# Patient Record
Sex: Male | Born: 1960
Health system: Southern US, Community
[De-identification: ages and names within clinical notes are randomized; demographics above are authoritative.]

## PROBLEM LIST (undated history)

## (undated) DIAGNOSIS — T7840XA Allergy, unspecified, initial encounter: Secondary | ICD-10-CM

## (undated) HISTORY — DX: Allergy, unspecified, initial encounter: T78.40XA

## (undated) HISTORY — PX: TONSILLECTOMY: SUR1361

## (undated) HISTORY — PX: OTHER SURGICAL HISTORY: SHX169

---

## 2005-01-20 ENCOUNTER — Ambulatory Visit (HOSPITAL_COMMUNITY): Admission: RE | Admit: 2005-01-20 | Discharge: 2005-01-20 | Payer: Self-pay | Admitting: Internal Medicine

## 2007-01-20 ENCOUNTER — Ambulatory Visit (HOSPITAL_COMMUNITY): Admission: RE | Admit: 2007-01-20 | Discharge: 2007-01-20 | Payer: Self-pay | Admitting: Internal Medicine

## 2008-03-23 ENCOUNTER — Ambulatory Visit (HOSPITAL_COMMUNITY): Admission: RE | Admit: 2008-03-23 | Discharge: 2008-03-23 | Payer: Self-pay | Admitting: Internal Medicine

## 2010-04-09 ENCOUNTER — Ambulatory Visit (HOSPITAL_COMMUNITY)
Admission: RE | Admit: 2010-04-09 | Discharge: 2010-04-09 | Disposition: A | Discharge status: Home / Self Care | Payer: BC Managed Care – PPO | Source: Ambulatory Visit | Attending: Internal Medicine | Admitting: Internal Medicine

## 2010-04-09 ENCOUNTER — Other Ambulatory Visit (HOSPITAL_COMMUNITY): Payer: Self-pay | Admitting: Internal Medicine

## 2010-04-09 DIAGNOSIS — F17200 Nicotine dependence, unspecified, uncomplicated: Secondary | ICD-10-CM | POA: Insufficient documentation

## 2010-04-09 DIAGNOSIS — J449 Chronic obstructive pulmonary disease, unspecified: Secondary | ICD-10-CM | POA: Insufficient documentation

## 2010-04-09 DIAGNOSIS — J4489 Other specified chronic obstructive pulmonary disease: Secondary | ICD-10-CM | POA: Insufficient documentation

## 2010-05-14 ENCOUNTER — Other Ambulatory Visit: Payer: BC Managed Care – PPO | Admitting: Gastroenterology

## 2011-09-22 ENCOUNTER — Ambulatory Visit (HOSPITAL_COMMUNITY)
Admission: RE | Admit: 2011-09-22 | Discharge: 2011-09-22 | Disposition: A | Discharge status: Home / Self Care | Payer: BC Managed Care – PPO | Source: Ambulatory Visit | Attending: Internal Medicine | Admitting: Internal Medicine

## 2011-09-22 ENCOUNTER — Other Ambulatory Visit (HOSPITAL_COMMUNITY): Payer: Self-pay | Admitting: Internal Medicine

## 2011-09-22 DIAGNOSIS — J449 Chronic obstructive pulmonary disease, unspecified: Secondary | ICD-10-CM | POA: Insufficient documentation

## 2011-09-22 DIAGNOSIS — Z0000 Encounter for general adult medical examination without abnormal findings: Secondary | ICD-10-CM

## 2011-09-22 DIAGNOSIS — J4489 Other specified chronic obstructive pulmonary disease: Secondary | ICD-10-CM | POA: Insufficient documentation

## 2011-09-22 DIAGNOSIS — F17200 Nicotine dependence, unspecified, uncomplicated: Secondary | ICD-10-CM | POA: Insufficient documentation

## 2012-09-23 ENCOUNTER — Other Ambulatory Visit (HOSPITAL_COMMUNITY): Payer: Self-pay | Admitting: Internal Medicine

## 2012-09-23 ENCOUNTER — Ambulatory Visit (HOSPITAL_COMMUNITY)
Admission: RE | Admit: 2012-09-23 | Discharge: 2012-09-23 | Disposition: A | Discharge status: Home / Self Care | Payer: BC Managed Care – PPO | Source: Ambulatory Visit | Attending: Internal Medicine | Admitting: Internal Medicine

## 2012-09-23 DIAGNOSIS — R030 Elevated blood-pressure reading, without diagnosis of hypertension: Secondary | ICD-10-CM

## 2012-09-23 DIAGNOSIS — J4489 Other specified chronic obstructive pulmonary disease: Secondary | ICD-10-CM | POA: Insufficient documentation

## 2012-09-23 DIAGNOSIS — J449 Chronic obstructive pulmonary disease, unspecified: Secondary | ICD-10-CM | POA: Insufficient documentation

## 2013-08-24 ENCOUNTER — Encounter: Payer: Self-pay | Admitting: Gastroenterology

## 2013-09-27 ENCOUNTER — Ambulatory Visit (INDEPENDENT_AMBULATORY_CARE_PROVIDER_SITE_OTHER): Payer: BC Managed Care – PPO | Admitting: Internal Medicine

## 2013-09-27 ENCOUNTER — Other Ambulatory Visit: Payer: Self-pay | Admitting: Internal Medicine

## 2013-09-27 ENCOUNTER — Encounter: Payer: Self-pay | Admitting: Internal Medicine

## 2013-09-27 VITALS — BP 116/66 | HR 72 | Temp 98.8°F | Resp 16 | Ht 68.0 in | Wt 164.0 lb

## 2013-09-27 DIAGNOSIS — E559 Vitamin D deficiency, unspecified: Secondary | ICD-10-CM | POA: Insufficient documentation

## 2013-09-27 DIAGNOSIS — Z113 Encounter for screening for infections with a predominantly sexual mode of transmission: Secondary | ICD-10-CM

## 2013-09-27 DIAGNOSIS — Z111 Encounter for screening for respiratory tuberculosis: Secondary | ICD-10-CM

## 2013-09-27 DIAGNOSIS — J45909 Unspecified asthma, uncomplicated: Secondary | ICD-10-CM | POA: Insufficient documentation

## 2013-09-27 DIAGNOSIS — Z79899 Other long term (current) drug therapy: Secondary | ICD-10-CM

## 2013-09-27 DIAGNOSIS — I10 Essential (primary) hypertension: Secondary | ICD-10-CM

## 2013-09-27 DIAGNOSIS — R7401 Elevation of levels of liver transaminase levels: Secondary | ICD-10-CM

## 2013-09-27 DIAGNOSIS — R740 Nonspecific elevation of levels of transaminase and lactic acid dehydrogenase [LDH]: Secondary | ICD-10-CM

## 2013-09-27 DIAGNOSIS — Z125 Encounter for screening for malignant neoplasm of prostate: Secondary | ICD-10-CM

## 2013-09-27 DIAGNOSIS — F17200 Nicotine dependence, unspecified, uncomplicated: Secondary | ICD-10-CM

## 2013-09-27 DIAGNOSIS — R7402 Elevation of levels of lactic acid dehydrogenase (LDH): Secondary | ICD-10-CM

## 2013-09-27 DIAGNOSIS — Z0000 Encounter for general adult medical examination without abnormal findings: Secondary | ICD-10-CM

## 2013-09-27 DIAGNOSIS — Z1212 Encounter for screening for malignant neoplasm of rectum: Secondary | ICD-10-CM

## 2013-09-27 LAB — CBC WITH DIFFERENTIAL/PLATELET
Basophils Absolute: 0 10*3/uL (ref 0.0–0.1)
Basophils Relative: 0 % (ref 0–1)
EOS ABS: 0.3 10*3/uL (ref 0.0–0.7)
Eosinophils Relative: 4 % (ref 0–5)
HCT: 46.4 % (ref 39.0–52.0)
Hemoglobin: 15.8 g/dL (ref 13.0–17.0)
Lymphocytes Relative: 18 % (ref 12–46)
Lymphs Abs: 1.4 10*3/uL (ref 0.7–4.0)
MCH: 30.9 pg (ref 26.0–34.0)
MCHC: 34.1 g/dL (ref 30.0–36.0)
MCV: 90.8 fL (ref 78.0–100.0)
Monocytes Absolute: 0.5 10*3/uL (ref 0.1–1.0)
Monocytes Relative: 6 % (ref 3–12)
NEUTROS PCT: 72 % (ref 43–77)
Neutro Abs: 5.4 10*3/uL (ref 1.7–7.7)
Platelets: 242 10*3/uL (ref 150–400)
RBC: 5.11 MIL/uL (ref 4.22–5.81)
RDW: 14.2 % (ref 11.5–15.5)
WBC: 7.5 10*3/uL (ref 4.0–10.5)

## 2013-09-27 LAB — HEMOGLOBIN A1C
HEMOGLOBIN A1C: 5.8 % — AB (ref <5.7)
MEAN PLASMA GLUCOSE: 120 mg/dL — AB (ref <117)

## 2013-09-27 MED ORDER — ALBUTEROL SULFATE HFA 108 (90 BASE) MCG/ACT IN AERS: 1.0000 | INHALATION_SPRAY | RESPIRATORY_TRACT | Status: DC | PRN

## 2013-09-27 NOTE — Progress Notes (Signed)
Patient ID: Dakota Yang, male   DOB: April 14, 1960, 53 y.o.   MRN: [id number]   Annual Screening Comprehensive Examination   This very nice 53 y.o.MWM presents for complete physical.  Patient has no major health issues. He does have hx/o infrequent mild flares of asthma related to allergies and always controlled with an prn Albuterol MDI.    Also, patient has a hx/o elevated A1c 5.7% in 2012 and 5.8% in 8/2013and again 5.7% in 09/2012. Finally, patient has history of Vitamin D Deficiency of 27 in 2012.   Medication List   albuterol 108 (90 BASE) MCG/ACT inhaler  Commonly known as:  VENTOLIN HFA  Inhale 1-2 puffs into the lungs every 4 (four) hours as needed for wheezing or shortness of breath.     multivitamin tablet  Take 1 tablet by mouth daily.     OVER THE COUNTER MEDICATION  Protein powder     VITAMIN D PO  Take 4,000 Units by mouth daily.     Allergies  Allergen Reactions  . Trovan [Alatrofloxacin] Other (See Comments)    Light-headed feeling   PMHx- Hx/o Asrhma, Vit D deficiency. PSHx- none  History   Social History  . Marital Status: Married    Spouse Name: N/A    Number of Children: N/A  . Years of Education: N/A   Occupational History  . Chemist for [employer]   Social History Main Topics  . Smoking status: Current Every Day Smoker -- 0.50 packs/day    Types: Cigarettes  . Smokeless tobacco: Not on file  . Alcohol Use: 5.0 oz/week    10 drink(s) per week  . Drug Use: Not on file  . Sexual Activity: Not on file    ROS Constitutional: Denies fever, chills, weight loss/gain, headaches, insomnia, fatigue, night sweats, and change in appetite. Eyes: Denies redness, blurred vision, diplopia, discharge, itchy, watery eyes.  ENT: Denies discharge, congestion, post nasal drip, epistaxis, sore throat, earache, hearing loss, dental pain, Tinnitus, Vertigo, Sinus pain, snoring.  Cardio: Denies chest pain, palpitations, irregular heartbeat, syncope,  dyspnea, diaphoresis, orthopnea, PND, claudication, edema Respiratory: denies cough, dyspnea, DOE, pleurisy, hoarseness, laryngitis, wheezing.  Gastrointestinal: Denies dysphagia, heartburn, reflux, water brash, pain, cramps, nausea, vomiting, bloating, diarrhea, constipation, hematemesis, melena, hematochezia, jaundice, hemorrhoids Genitourinary: Denies dysuria, frequency, urgency, nocturia, hesitancy, discharge, hematuria, flank pain Breast: Breast lumps, nipple discharge, bleeding.  Musculoskeletal: Denies arthralgia, myalgia, stiffness, Jt. Swelling, pain, limp, and strain/sprain. Skin: Denies puritis, rash, hives, warts, acne, eczema, changing in skin lesion Neuro: Weakness, tremor, incoordination, spasms, paresthesia, pain Psychiatric: Denies confusion, memory loss, sensory loss. Endocrine: Denies change in weight, skin, hair change, nocturia, and paresthesia, diabetic polys, visual blurring, hyper /hypo glycemic episodes.  Heme/Lymph: No excessive bleeding, bruising, enlarged lymph nodes.   Physical Exam  BP 116/66  Pulse 72  Temp(Src) 98.8 F (37.1 C) (Temporal)  Resp 16  Ht 5\' 8"  (1.727 m)  Wt 164 lb (74.39 kg)  BMI 24.94 kg/m2  General Appearance: Well nourished and in no apparent distress. Eyes: PERRLA, EOMs, conjunctiva no swelling or erythema, normal fundi and vessels. Sinuses: No frontal/maxillary tenderness ENT/Mouth: EACs patent / TMs  nl. Nares clear without erythema, swelling, mucoid exudates. Oral hygiene is good. No erythema, swelling, or exudate. Tongue normal, non-obstructing. Tonsils not swollen or erythematous. Hearing normal.  Neck: Supple, thyroid normal. No bruits, nodes or JVD. Respiratory: Respiratory effort normal.  BS equal and clear bilateral without rales, rhonci, wheezing or stridor. Cardio: Heart sounds are normal with  regular rate and rhythm and no murmurs, rubs or gallops. Peripheral pulses are normal and equal bilaterally without edema. No aortic or  femoral bruits. Chest: symmetric with normal excursions and percussion.  Abdomen: Flat, soft, with bowl sounds. Nontender, no guarding, rebound, hernias, masses, or organomegaly.  Lymphatics: Non tender without lymphadenopathy.  Genitourinary:  Musculoskeletal: Full ROM all peripheral extremities, joint stability, 5/5 strength, and normal gait. Skin: Warm and dry without rashes, lesions, cyanosis, clubbing or  ecchymosis.  Neuro: Cranial nerves intact, reflexes equal bilaterally. Normal muscle tone, no cerebellar symptoms. Sensation intact.  Pysch: Awake and oriented X 3, normal affect, Insight and Judgment appropriate.   Assessment and Plan  1. Annual Screening Examination 2. Labile HTN 3.  Asthma 4.  Vitamin D Deficiency  Continue prudent diet as discussed, weight control, regular exercise, and medications. Routine screening labs and tests as requested with regular follow-up as recommended.

## 2013-09-27 NOTE — Patient Instructions (Signed)
Recommend the book "The END of DIETING" by Dr Baker Janus   and the book "The END of DIABETES " by Dr Excell Seltzer  At [email or web address] - get book & Audio CD's      Being diabetic has a  300% increased risk for heart attack, stroke, cancer, and alzheimer- type vascular dementia. It is very important that you work harder with diet by avoiding all foods that are white except chicken & fish. Avoid white rice (brown & wild rice is OK), white potatoes (sweetpotatoes in moderation is OK), White bread or wheat bread or anything made out of white flour like bagels, donuts, rolls, buns, biscuits, cakes, pastries, cookies, pizza crust, and pasta (made from white flour & egg whites) - vegetarian pasta or spinach or wheat pasta is OK. Multigrain breads like Arnold's or [employer], or multigrain sandwich thins or flatbreads.  Diet, exercise and weight loss can reverse and cure diabetes in the early stages.  Diet, exercise and weight loss is very important in the control and prevention of complications of diabetes which affects every system in your body, ie. Brain - dementia/stroke, eyes - glaucoma/blindness, heart - heart attack/heart failure, kidneys - dialysis, stomach - gastric paralysis, intestines - malabsorption, nerves - severe painful neuritis, circulation - gangrene & loss of a leg(s), and finally cancer and Alzheimers.    I recommend avoid fried & greasy foods,  sweets/candy, white rice (brown or wild rice or Quinoa is OK), white potatoes (sweet potatoes are OK) - anything made from white flour - bagels, doughnuts, rolls, buns, biscuits,white and wheat breads, pizza crust and traditional pasta made of white flour & egg white(vegetarian pasta or spinach or wheat pasta is OK).  Multi-grain bread is OK - like multi-grain flat bread or sandwich thins. Avoid alcohol in excess. Exercise is also important.    Eat all the vegetables you want - avoid meat, especially red meat and dairy - especially cheese.  Cheese  is the most concentrated form of trans-fats which is the worst thing to clog up our arteries. Veggie cheese is OK which can be found in the fresh produce section at [employer] or [employer] or [place] [employer] A healthy lifestyle and preventative care can promote health and wellness.  Maintain regular health, dental, and eye exams.  Eat a healthy diet. Foods like vegetables, fruits, whole grains, low-fat dairy products, and lean protein foods contain the nutrients you need and are low in calories. Decrease your intake of foods high in solid fats, added sugars, and salt. Get information about a proper diet from your health care provider, if necessary.  Regular physical exercise is one of the most important things you can do for your health. Most adults should get at least 150 minutes of moderate-intensity exercise (any activity that increases your heart rate and causes you to sweat) each week. In addition, most adults need muscle-strengthening exercises on 2 or more days a week.   Maintain a healthy weight. The body mass index (BMI) is a screening tool to identify possible weight problems. It provides an estimate of body fat based on height and weight. Your health care provider can find your BMI and can help you achieve or maintain a healthy weight. For males 20 years and older:  A BMI below 18.5 is considered underweight.  A BMI of 18.5 to 24.9 is normal.  A BMI of 25 to 29.9 is considered overweight.  A BMI of 30 and above is considered  obese.  Maintain normal blood lipids and cholesterol by exercising and minimizing your intake of saturated fat. Eat a balanced diet with plenty of fruits and vegetables. Blood tests for lipids and cholesterol should begin at age 29 and be repeated every 5 years. If your lipid or cholesterol levels are high, you are over age 76, or you are at high risk for heart disease, you may need your cholesterol levels checked more frequently.Ongoing  high lipid and cholesterol levels should be treated with medicines if diet and exercise are not working.  If you smoke, find out from your health care provider how to quit. If you do not use tobacco, do not start.  Lung cancer screening is recommended for adults aged 78-80 years who are at high risk for developing lung cancer because of a history of smoking. A yearly low-dose CT scan of the lungs is recommended for people who have at least a 30-pack-year history of smoking and are current smokers or have quit within the past 15 years. A pack year of smoking is smoking an average of 1 pack of cigarettes a day for 1 year (for example, a 30-pack-year history of smoking could mean smoking 1 pack a day for 30 years or 2 packs a day for 15 years). Yearly screening should continue until the smoker has stopped smoking for at least 15 years. Yearly screening should be stopped for people who develop a health problem that would prevent them from having lung cancer treatment.  If you choose to drink alcohol, do not have more than 2 drinks per day. One drink is considered to be 12 oz (360 mL) of beer, 5 oz (150 mL) of wine, or 1.5 oz (45 mL) of liquor.  Avoid the use of street drugs. Do not share needles with anyone. Ask for help if you need support or instructions about stopping the use of drugs.  High blood pressure causes heart disease and increases the risk of stroke. Blood pressure should be checked at least every 1-2 years. Ongoing high blood pressure should be treated with medicines if weight loss and exercise are not effective.  If you are 34-62 years old, ask your health care provider if you should take aspirin to prevent heart disease.  Diabetes screening involves taking a blood sample to check your fasting blood sugar level. This should be done once every 3 years after age 35 if you are at a normal weight and without risk factors for diabetes. Testing should be considered at a younger age or be carried  out more frequently if you are overweight and have at least 1 risk factor for diabetes.  Colorectal cancer can be detected and often prevented. Most routine colorectal cancer screening begins at the age of 104 and continues through age 10. However, your health care provider may recommend screening at an earlier age if you have risk factors for colon cancer. On a yearly basis, your health care provider may provide home test kits to check for hidden blood in the stool. A small camera at the end of a tube may be used to directly examine the colon (sigmoidoscopy or colonoscopy) to detect the earliest forms of colorectal cancer. Talk to your health care provider about this at age 54 when routine screening begins. A direct exam of the colon should be repeated every 5-10 years through age 34, unless early forms of precancerous polyps or small growths are found.  People who are at an increased risk for hepatitis B should be  screened for this virus. You are considered at high risk for hepatitis B if:  You were born in a country where hepatitis B occurs often. Talk with your health care provider about which countries are considered high risk.  Your parents were born in a high-risk country and you have not received a shot to protect against hepatitis B (hepatitis B vaccine).  You have HIV or AIDS.  You use needles to inject street drugs.  You live with, or have sex with, someone who has hepatitis B.  You are a man who has sex with other men (MSM).  You get hemodialysis treatment.  You take certain medicines for conditions like cancer, organ transplantation, and autoimmune conditions.  Hepatitis C blood testing is recommended for all people born from 18 through 1965 and any individual with known risk factors for hepatitis C.  Healthy men should no longer receive prostate-specific antigen (PSA) blood tests as part of routine cancer screening. Talk to your health care provider about prostate cancer  screening.  Testicular cancer screening is not recommended for adolescents or adult males who have no symptoms. Screening includes self-exam, a health care provider exam, and other screening tests. Consult with your health care provider about any symptoms you have or any concerns you have about testicular cancer.  Practice safe sex. Use condoms and avoid high-risk sexual practices to reduce the spread of sexually transmitted infections (STIs).  You should be screened for STIs, including gonorrhea and chlamydia if:  You are sexually active and are younger than 24 years.  You are older than 24 years, and your health care provider tells you that you are at risk for this type of infection.  Your sexual activity has changed since you were last screened, and you are at an increased risk for chlamydia or gonorrhea. Ask your health care provider if you are at risk.  If you are at risk of being infected with HIV, it is recommended that you take a prescription medicine daily to prevent HIV infection. This is called pre-exposure prophylaxis (PrEP). You are considered at risk if:  You are a man who has sex with other men (MSM).  You are a heterosexual man who is sexually active with multiple partners.  You take drugs by injection.  You are sexually active with a partner who has HIV.  Talk with your health care provider about whether you are at high risk of being infected with HIV. If you choose to begin PrEP, you should first be tested for HIV. You should then be tested every 3 months for as long as you are taking PrEP.  Use sunscreen. Apply sunscreen liberally and repeatedly throughout the day. You should seek shade when your shadow is shorter than you. Protect yourself by wearing long sleeves, pants, a wide-brimmed hat, and sunglasses year round whenever you are outdoors.  Tell your health care provider of new moles or changes in moles, especially if there is a change in shape or color. Also, tell  your health care provider if a mole is larger than the size of a pencil eraser.  A one-time screening for abdominal aortic aneurysm (AAA) and surgical repair of large AAAs by ultrasound is recommended for men aged 12-75 years who are current or former smokers.  Stay current with your vaccines (immunizations).  Preventive Care for Adults A healthy lifestyle and preventive care can promote health and wellness. Preventive health guidelines for men include the following key practices:  A routine yearly physical is a  good way to check with your health care provider about your health and preventative screening. It is a chance to share any concerns and updates on your health and to receive a thorough exam.  Visit your dentist for a routine exam and preventative care every 6 months. Brush your teeth twice a day and floss once a day. Good oral hygiene prevents tooth decay and gum disease.  The frequency of eye exams is based on your age, health, family medical history, use of contact lenses, and other factors. Follow your health care provider's recommendations for frequency of eye exams.  Eat a healthy diet. Foods such as vegetables, fruits, whole grains, low-fat dairy products, and lean protein foods contain the nutrients you need without too many calories. Decrease your intake of foods high in solid fats, added sugars, and salt. Eat the right amount of calories for you.Get information about a proper diet from your health care provider, if necessary.  Regular physical exercise is one of the most important things you can do for your health. Most adults should get at least 150 minutes of moderate-intensity exercise (any activity that increases your heart rate and causes you to sweat) each week. In addition, most adults need muscle-strengthening exercises on 2 or more days a week.  Maintain a healthy weight. The body mass index (BMI) is a screening tool to identify possible weight problems. It provides an  estimate of body fat based on height and weight. Your health care provider can find your BMI and can help you achieve or maintain a healthy weight.For adults 20 years and older:  A BMI below 18.5 is considered underweight.  A BMI of 18.5 to 24.9 is normal.  A BMI of 25 to 29.9 is considered overweight.  A BMI of 30 and above is considered obese.  Maintain normal blood lipids and cholesterol levels by exercising and minimizing your intake of saturated fat. Eat a balanced diet with plenty of fruit and vegetables. Blood tests for lipids and cholesterol should begin at age 26 and be repeated every 5 years. If your lipid or cholesterol levels are high, you are over 50, or you are at high risk for heart disease, you may need your cholesterol levels checked more frequently.Ongoing high lipid and cholesterol levels should be treated with medicines if diet and exercise are not working.  If you smoke, find out from your health care provider how to quit. If you do not use tobacco, do not start.  Lung cancer screening is recommended for adults aged 37-80 years who are at high risk for developing lung cancer because of a history of smoking. A yearly low-dose CT scan of the lungs is recommended for people who have at least a 30-pack-year history of smoking and are a current smoker or have quit within the past 15 years. A pack year of smoking is smoking an average of 1 pack of cigarettes a day for 1 year (for example: 1 pack a day for 30 years or 2 packs a day for 15 years). Yearly screening should continue until the smoker has stopped smoking for at least 15 years. Yearly screening should be stopped for people who develop a health problem that would prevent them from having lung cancer treatment.  If you choose to drink alcohol, do not have more than 2 drinks per day. One drink is considered to be 12 ounces (355 mL) of beer, 5 ounces (148 mL) of wine, or 1.5 ounces (44 mL) of liquor.  Avoid use  of street  drugs. Do not share needles with anyone. Ask for help if you need support or instructions about stopping the use of drugs.  High blood pressure causes heart disease and increases the risk of stroke. Your blood pressure should be checked at least every 1-2 years. Ongoing high blood pressure should be treated with medicines, if weight loss and exercise are not effective.  If you are 35-57 years old, ask your health care provider if you should take aspirin to prevent heart disease.  Diabetes screening involves taking a blood sample to check your fasting blood sugar level. This should be done once every 3 years, after age 14, if you are within normal weight and without risk factors for diabetes. Testing should be considered at a younger age or be carried out more frequently if you are overweight and have at least 1 risk factor for diabetes.  Colorectal cancer can be detected and often prevented. Most routine colorectal cancer screening begins at the age of 18 and continues through age 27. However, your health care provider may recommend screening at an earlier age if you have risk factors for colon cancer. On a yearly basis, your health care provider may provide home test kits to check for hidden blood in the stool. Use of a small camera at the end of a tube to directly examine the colon (sigmoidoscopy or colonoscopy) can detect the earliest forms of colorectal cancer. Talk to your health care provider about this at age 42, when routine screening begins. Direct exam of the colon should be repeated every 5-10 years through age 88, unless early forms of precancerous polyps or small growths are found.  People who are at an increased risk for hepatitis B should be screened for this virus. You are considered at high risk for hepatitis B if:  You were born in a country where hepatitis B occurs often. Talk with your health care provider about which countries are considered high risk.  Your parents were born in a  high-risk country and you have not received a shot to protect against hepatitis B (hepatitis B vaccine).  You have HIV or AIDS.  You use needles to inject street drugs.  You live with, or have sex with, someone who has hepatitis B.  You are a man who has sex with other men (MSM).  You get hemodialysis treatment.  You take certain medicines for conditions such as cancer, organ transplantation, and autoimmune conditions.  Hepatitis C blood testing is recommended for all people born from [birth year] through 1966 and any individual with known risks for hepatitis C.  Practice safe sex. Use condoms and avoid high-risk sexual practices to reduce the spread of sexually transmitted infections (STIs). STIs include gonorrhea, chlamydia, syphilis, trichomonas, herpes, HPV, and human immunodeficiency virus (HIV). Herpes, HIV, and HPV are viral illnesses that have no cure. They can result in disability, cancer, and death.  If you are at risk of being infected with HIV, it is recommended that you take a prescription medicine daily to prevent HIV infection. This is called preexposure prophylaxis (PrEP). You are considered at risk if:  You are a man who has sex with other men (MSM) and have other risk factors.  You are a heterosexual man, are sexually active, and are at increased risk for HIV infection.  You take drugs by injection.  You are sexually active with a partner who has HIV.  Talk with your health care provider about whether you are at high risk of  being infected with HIV. If you choose to begin PrEP, you should first be tested for HIV. You should then be tested every 3 months for as long as you are taking PrEP.  A one-time screening for abdominal aortic aneurysm (AAA) and surgical repair of large AAAs by ultrasound are recommended for men ages 68 to 30 years who are current or former smokers.  Healthy men should no longer receive prostate-specific antigen (PSA) blood tests as part of routine  cancer screening. Talk with your health care provider about prostate cancer screening.  Testicular cancer screening is not recommended for adult males who have no symptoms. Screening includes self-exam, a health care provider exam, and other screening tests. Consult with your health care provider about any symptoms you have or any concerns you have about testicular cancer.  Use sunscreen. Apply sunscreen liberally and repeatedly throughout the day. You should seek shade when your shadow is shorter than you. Protect yourself by wearing long sleeves, pants, a wide-brimmed hat, and sunglasses year round, whenever you are outdoors.  Once a month, do a whole-body skin exam, using a mirror to look at the skin on your back. Tell your health care provider about new moles, moles that have irregular borders, moles that are larger than a pencil eraser, or moles that have changed in shape or color.  Stay current with required vaccines (immunizations).  Influenza vaccine. All adults should be immunized every year.  Tetanus, diphtheria, and acellular pertussis (Td, Tdap) vaccine. An adult who has not previously received Tdap or who does not know his vaccine status should receive 1 dose of Tdap. This initial dose should be followed by tetanus and diphtheria toxoids (Td) booster doses every 10 years. Adults with an unknown or incomplete history of completing a 3-dose immunization series with Td-containing vaccines should begin or complete a primary immunization series including a Tdap dose. Adults should receive a Td booster every 10 years.  Varicella vaccine. An adult without evidence of immunity to varicella should receive 2 doses or a second dose if he has previously received 1 dose.  Human papillomavirus (HPV) vaccine. Males aged 32-21 years who have not received the vaccine previously should receive the 3-dose series. Males aged 22-26 years may be immunized. Immunization is recommended through the age of 15  years for any male who has sex with males and did not get any or all doses earlier. Immunization is recommended for any person with an immunocompromised condition through the age of 58 years if he did not get any or all doses earlier. During the 3-dose series, the second dose should be obtained 4-8 weeks after the first dose. The third dose should be obtained 24 weeks after the first dose and 16 weeks after the second dose.  Zoster vaccine. One dose is recommended for adults aged 35 years or older unless certain conditions are present.  Measles, mumps, and rubella (MMR) vaccine. Adults born before [birth year] generally are considered immune to measles and mumps. Adults born in [birth year] or later should have 1 or more doses of MMR vaccine unless there is a contraindication to the vaccine or there is laboratory evidence of immunity to each of the three diseases. A routine second dose of MMR vaccine should be obtained at least 28 days after the first dose for students attending postsecondary schools, health care workers, or international travelers. People who received inactivated measles vaccine or an unknown type of measles vaccine during 1963-1967 should receive 2 doses of MMR vaccine. People who  received inactivated mumps vaccine or an unknown type of mumps vaccine before 1979 and are at high risk for mumps infection should consider immunization with 2 doses of MMR vaccine. Unvaccinated health care workers born before [birth year] who lack laboratory evidence of measles, mumps, or rubella immunity or laboratory confirmation of disease should consider measles and mumps immunization with 2 doses of MMR vaccine or rubella immunization with 1 dose of MMR vaccine.  Pneumococcal 13-valent conjugate (PCV13) vaccine. When indicated, a person who is uncertain of his immunization history and has no record of immunization should receive the PCV13 vaccine. An adult aged 70 years or older who has certain medical conditions and has not been  previously immunized should receive 1 dose of PCV13 vaccine. This PCV13 should be followed with a dose of pneumococcal polysaccharide (PPSV23) vaccine. The PPSV23 vaccine dose should be obtained at least 8 weeks after the dose of PCV13 vaccine. An adult aged 26 years or older who has certain medical conditions and previously received 1 or more doses of PPSV23 vaccine should receive 1 dose of PCV13. The PCV13 vaccine dose should be obtained 1 or more years after the last PPSV23 vaccine dose.  Pneumococcal polysaccharide (PPSV23) vaccine. When PCV13 is also indicated, PCV13 should be obtained first. All adults aged 86 years and older should be immunized. An adult younger than age 10 years who has certain medical conditions should be immunized. Any person who resides in a nursing home or long-term care facility should be immunized. An adult smoker should be immunized. People with an immunocompromised condition and certain other conditions should receive both PCV13 and PPSV23 vaccines. People with human immunodeficiency virus (HIV) infection should be immunized as soon as possible after diagnosis. Immunization during chemotherapy or radiation therapy should be avoided. Routine use of PPSV23 vaccine is not recommended for American Indians, [place] Natives, or people younger than 65 years unless there are medical conditions that require PPSV23 vaccine. When indicated, people who have unknown immunization and have no record of immunization should receive PPSV23 vaccine. One-time revaccination 5 years after the first dose of PPSV23 is recommended for people aged 19-64 years who have chronic kidney failure, nephrotic syndrome, asplenia, or immunocompromised conditions. People who received 1-2 doses of PPSV23 before age 67 years should receive another dose of PPSV23 vaccine at age 27 years or later if at least 5 years have passed since the previous dose. Doses of PPSV23 are not needed for people immunized with PPSV23 at or  after age 20 years.  Meningococcal vaccine. Adults with asplenia or persistent complement component deficiencies should receive 2 doses of quadrivalent meningococcal conjugate (MenACWY-D) vaccine. The doses should be obtained at least 2 months apart. Microbiologists working with certain meningococcal bacteria, [employer] recruits, people at risk during an outbreak, and people who travel to or live in countries with a high rate of meningitis should be immunized. A first-year college student up through age 49 years who is living in a residence hall should receive a dose if he did not receive a dose on or after his 16th birthday. Adults who have certain high-risk conditions should receive one or more doses of vaccine.  Hepatitis A vaccine. Adults who wish to be protected from this disease, have certain high-risk conditions, work with hepatitis A-infected animals, work in hepatitis A research labs, or travel to or work in countries with a high rate of hepatitis A should be immunized. Adults who were previously unvaccinated and who anticipate close contact with an international adoptee during  the first 60 days after arrival in the [place] from a country with a high rate of hepatitis A should be immunized.  Hepatitis B vaccine. Adults should be immunized if they wish to be protected from this disease, have certain high-risk conditions, may be exposed to blood or other infectious body fluids, are household contacts or sex partners of hepatitis B positive people, are clients or workers in certain care facilities, or travel to or work in countries with a high rate of hepatitis B.  Haemophilus influenzae type b (Hib) vaccine. A previously unvaccinated person with asplenia or sickle cell disease or having a scheduled splenectomy should receive 1 dose of Hib vaccine. Regardless of previous immunization, a recipient of a hematopoietic stem cell transplant should receive a 3-dose series 6-12 months after his  successful transplant. Hib vaccine is not recommended for adults with HIV infection. Preventive Service / Frequency Ages 40 to 80  Blood pressure check.** / Every 1 to 2 years.  Lipid and cholesterol check.** / Every 5 years beginning at age 79.  Lung cancer screening. / Every year if you are aged 33-80 years and have a 30-pack-year history of smoking and currently smoke or have quit within the past 15 years. Yearly screening is stopped once you have quit smoking for at least 15 years or develop a health problem that would prevent you from having lung cancer treatment.  Fecal occult blood test (FOBT) of stool. / Every year beginning at age 36 and continuing until age 2. You may not have to do this test if you get a colonoscopy every 10 years.  Flexible sigmoidoscopy** or colonoscopy.** / Every 5 years for a flexible sigmoidoscopy or every 10 years for a colonoscopy beginning at age 47 and continuing until age 28.  Hepatitis C blood test.** / For all people born from [birth year] through 1965 and any individual with known risks for hepatitis C.  Skin self-exam. / Monthly.  Influenza vaccine. / Every year.  Tetanus, diphtheria, and acellular pertussis (Tdap/Td) vaccine.** / Consult your health care provider. 1 dose of Td every 10 years.  Varicella vaccine.** / Consult your health care provider.  Zoster vaccine.** / 1 dose for adults aged 68 years or older.  Measles, mumps, rubella (MMR) vaccine.** / You need at least 1 dose of MMR if you were born in 1957 or later. You may also need a second dose.  Pneumococcal 13-valent conjugate (PCV13) vaccine.** / Consult your health care provider.  Pneumococcal polysaccharide (PPSV23) vaccine.** / 1 to 2 doses if you smoke cigarettes or if you have certain conditions.  Meningococcal vaccine.** / Consult your health care provider.  Hepatitis A vaccine.** / Consult your health care provider.  Hepatitis B vaccine.** / Consult your health care  provider.  Haemophilus influenzae type b (Hib) vaccine.** / Consult your health care provider.    Hepatitis A vaccine.** / Consult your health care provider.  Hepatitis B vaccine.** / Consult your health care provider.  Haemophilus influenzae type b (Hib) vaccine.** / Consult your health care provider.

## 2013-09-28 LAB — HEPATIC FUNCTION PANEL
ALT: 20 U/L (ref 0–53)
AST: 24 U/L (ref 0–37)
Albumin: 4.3 g/dL (ref 3.5–5.2)
Alkaline Phosphatase: 37 U/L — ABNORMAL LOW (ref 39–117)
Bilirubin, Direct: 0.1 mg/dL (ref 0.0–0.3)
Indirect Bilirubin: 0.3 mg/dL (ref 0.2–1.2)
Total Bilirubin: 0.4 mg/dL (ref 0.2–1.2)
Total Protein: 6.5 g/dL (ref 6.0–8.3)

## 2013-09-28 LAB — PSA: PSA: 0.35 ng/mL (ref <=4.00)

## 2013-09-28 LAB — MAGNESIUM: Magnesium: 2 mg/dL (ref 1.5–2.5)

## 2013-09-28 LAB — URINALYSIS, MICROSCOPIC ONLY
Bacteria, UA: NONE SEEN
Casts: NONE SEEN
Crystals: NONE SEEN
Squamous Epithelial / LPF: NONE SEEN

## 2013-09-28 LAB — TSH: TSH: 1.325 u[IU]/mL (ref 0.350–4.500)

## 2013-09-28 LAB — LIPID PANEL
CHOL/HDL RATIO: 2.5 ratio
Cholesterol: 171 mg/dL (ref 0–200)
HDL: 68 mg/dL (ref >39)
LDL Cholesterol: 89 mg/dL (ref 0–99)
Triglycerides: 70 mg/dL (ref <150)
VLDL: 14 mg/dL (ref 0–40)

## 2013-09-28 LAB — BASIC METABOLIC PANEL WITH GFR
BUN: 15 mg/dL (ref 6–23)
CO2: 27 mEq/L (ref 19–32)
Calcium: 8.9 mg/dL (ref 8.4–10.5)
Chloride: 104 mEq/L (ref 96–112)
Creat: 1.16 mg/dL (ref 0.50–1.35)
GFR, Est African American: 83 mL/min
GFR, Est Non African American: 71 mL/min
Glucose, Bld: 100 mg/dL — ABNORMAL HIGH (ref 70–99)
Potassium: 4.2 mEq/L (ref 3.5–5.3)
Sodium: 138 mEq/L (ref 135–145)

## 2013-09-28 LAB — HIV ANTIBODY (ROUTINE TESTING W REFLEX): HIV 1&2 Ab, 4th Generation: NONREACTIVE

## 2013-09-28 LAB — MICROALBUMIN / CREATININE URINE RATIO
Creatinine, Urine: 135.8 mg/dL
Microalb Creat Ratio: 3.7 mg/g (ref 0.0–30.0)
Microalb, Ur: 0.5 mg/dL (ref 0.00–1.89)

## 2013-09-28 LAB — RPR

## 2013-09-28 LAB — TESTOSTERONE: Testosterone: 431 ng/dL (ref 300–890)

## 2013-09-28 LAB — INSULIN, FASTING: Insulin fasting, serum: 35 u[IU]/mL — ABNORMAL HIGH (ref 3–28)

## 2013-09-28 LAB — HEPATITIS B CORE ANTIBODY, TOTAL: Hep B Core Total Ab: NONREACTIVE

## 2013-09-28 LAB — VITAMIN B12: Vitamin B-12: 497 pg/mL (ref 211–911)

## 2013-09-28 LAB — HEPATITIS A ANTIBODY, TOTAL: Hep A Total Ab: BORDERLINE — AB

## 2013-09-28 LAB — HEPATITIS B SURFACE ANTIBODY,QUALITATIVE: Hep B S Ab: NEGATIVE

## 2013-09-28 LAB — HEPATITIS C ANTIBODY: HCV AB: NEGATIVE

## 2013-09-28 LAB — VITAMIN D 25 HYDROXY (VIT D DEFICIENCY, FRACTURES): Vit D, 25-Hydroxy: 58 ng/mL (ref 30–89)

## 2013-09-29 LAB — HEPATITIS B E ANTIBODY: Hepatitis Be Antibody: NONREACTIVE

## 2013-09-30 LAB — TB SKIN TEST
Induration: 0 mm
TB SKIN TEST: NEGATIVE

## 2013-10-07 ENCOUNTER — Ambulatory Visit (HOSPITAL_COMMUNITY)
Admission: RE | Admit: 2013-10-07 | Discharge: 2013-10-07 | Disposition: A | Discharge status: Home / Self Care | Payer: BC Managed Care – PPO | Source: Ambulatory Visit | Attending: Internal Medicine | Admitting: Internal Medicine

## 2013-10-07 DIAGNOSIS — R05 Cough: Secondary | ICD-10-CM | POA: Diagnosis present

## 2013-10-07 DIAGNOSIS — R059 Cough, unspecified: Secondary | ICD-10-CM | POA: Diagnosis not present

## 2013-10-07 DIAGNOSIS — F17200 Nicotine dependence, unspecified, uncomplicated: Secondary | ICD-10-CM

## 2013-10-25 ENCOUNTER — Ambulatory Visit (AMBULATORY_SURGERY_CENTER): Payer: Self-pay | Admitting: *Deleted

## 2013-10-25 VITALS — Ht 69.0 in | Wt 160.6 lb

## 2013-10-25 DIAGNOSIS — Z1211 Encounter for screening for malignant neoplasm of colon: Secondary | ICD-10-CM

## 2013-10-25 MED ORDER — NA SULFATE-K SULFATE-MG SULF 17.5-3.13-1.6 GM/177ML PO SOLN: 1.0000 | Freq: Once | ORAL | Status: DC

## 2013-10-25 NOTE — Progress Notes (Signed)
No problems with past sedation. ewm No home 02 use. ewm No cpap use. ewm No egg or soy product allergy. ewm

## 2013-10-26 ENCOUNTER — Other Ambulatory Visit: Payer: Self-pay | Admitting: *Deleted

## 2013-10-26 DIAGNOSIS — Z1212 Encounter for screening for malignant neoplasm of rectum: Secondary | ICD-10-CM

## 2013-10-26 LAB — POC HEMOCCULT BLD/STL (HOME/3-CARD/SCREEN)
FECAL OCCULT BLD: NEGATIVE
FECAL OCCULT BLD: NEGATIVE
FECAL OCCULT BLD: NEGATIVE

## 2013-10-31 ENCOUNTER — Encounter: Payer: Self-pay | Admitting: Gastroenterology

## 2013-11-04 ENCOUNTER — Telehealth: Payer: Self-pay | Admitting: Gastroenterology

## 2013-11-04 NOTE — Telephone Encounter (Signed)
Give miralax per dr Arlyce Dice  New instructions faxed to pharmacy  Left message for pt

## 2013-11-05 ENCOUNTER — Telehealth: Payer: Self-pay | Admitting: Internal Medicine

## 2013-11-05 NOTE — Telephone Encounter (Signed)
Needs prep instructions Sent by My chart

## 2013-11-07 ENCOUNTER — Encounter: Payer: Self-pay | Admitting: Gastroenterology

## 2013-11-07 ENCOUNTER — Ambulatory Visit (AMBULATORY_SURGERY_CENTER): Payer: BC Managed Care – PPO | Admitting: Gastroenterology

## 2013-11-07 VITALS — BP 112/58 | HR 61 | Temp 97.0°F | Resp 16 | Ht 69.0 in | Wt 160.0 lb

## 2013-11-07 DIAGNOSIS — Z1211 Encounter for screening for malignant neoplasm of colon: Secondary | ICD-10-CM

## 2013-11-07 MED ORDER — SODIUM CHLORIDE 0.9 % IV SOLN: 500.0000 mL | INTRAVENOUS | Status: DC

## 2013-11-07 NOTE — Op Note (Signed)
[facility] [address] [place], [address]   COLONOSCOPY PROCEDURE REPORT  PATIENT: Dakota Yang, Dakota Yang  MR#: [id number] BIRTHDATE: 1960-04-26 , 53  yrs. old GENDER: male ENDOSCOPIST: Louis Meckel, MD REFERRED FI:EPPIRJJ Oneta Rack, M.D. PROCEDURE DATE:  11/07/2013 PROCEDURE:   Colonoscopy, diagnostic First Screening Colonoscopy - Avg.  risk and is 50 yrs.  old or older Yes.  Prior Negative Screening - Now for repeat screening. N/A  History of Adenoma - Now for follow-up colonoscopy & has been > or = to 3 yrs.  N/A  Polyps Removed Today? No.  Recommend repeat exam, <10 yrs? No. ASA CLASS:   Class I INDICATIONS:average risk for colon cancer. MEDICATIONS: Per Anesthesia and Propofol (Diprivan) 140 mg IV  DESCRIPTION OF PROCEDURE:   After the risks benefits and alternatives of the procedure were thoroughly explained, informed consent was obtained. Digital exam revealed  revealed no abnormalities of the rectum.   The [id number]  endoscope was introduced through the anus and advanced to the cecum, which was identified by both the appendix and ileocecal valve. No adverse events experienced.   The quality of the prep was Suprep good  The instrument was then slowly withdrawn as the colon was fully examined.      COLON FINDINGS: A normal appearing cecum, ileocecal valve, and appendiceal orifice were identified.  the ascending, transverse, descending, sigmoid colon, and rectum appeared unremarkable. Retroflexed views revealed no abnormalities. The time to cecum=1 minutes 55 seconds.  Withdrawal time=3 minutes 11 seconds.  The scope was withdrawn and the procedure completed. COMPLICATIONS: There were no complications.  ENDOSCOPIC IMPRESSION: Normal colonoscopy  RECOMMENDATIONS: Continue current colorectal screening recommendations for "routine risk" patients with a repeat colonoscopy in 10 years.  eSigned:  Louis Meckel, MD 11/07/2013 10:05  AM   cc:   PATIENT NAME:  Dakota Yang, Dakota Yang MR#: [id number]

## 2013-11-07 NOTE — Patient Instructions (Signed)
YOU HAD AN ENDOSCOPIC PROCEDURE TODAY AT THE [facility]: Refer to the procedure report that was given to you for any specific questions about what was found during the examination.  If the procedure report does not answer your questions, please call your gastroenterologist to clarify.  If you requested that your care partner not be given the details of your procedure findings, then the procedure report has been included in a sealed envelope for you to review at your convenience later.  YOU SHOULD EXPECT: Some feelings of bloating in the abdomen. Passage of more gas than usual.  Walking can help get rid of the air that was put into your GI tract during the procedure and reduce the bloating. If you had a lower endoscopy (such as a colonoscopy or flexible sigmoidoscopy) you may notice spotting of blood in your stool or on the toilet paper. If you underwent a bowel prep for your procedure, then you may not have a normal bowel movement for a few days.  DIET: Your first meal following the procedure should be a light meal and then it is ok to progress to your normal diet.  A half-sandwich or bowl of soup is an example of a good first meal.  Heavy or fried foods are harder to digest and may make you feel nauseous or bloated.  Likewise meals heavy in dairy and vegetables can cause extra gas to form and this can also increase the bloating.  Drink plenty of fluids but you should avoid alcoholic beverages for 24 hours.  ACTIVITY: Your care partner should take you home directly after the procedure.  You should plan to take it easy, moving slowly for the rest of the day.  You can resume normal activity the day after the procedure however you should NOT DRIVE or use heavy machinery for 24 hours (because of the sedation medicines used during the test).    SYMPTOMS TO REPORT IMMEDIATELY: A gastroenterologist can be reached at any hour.  During normal business hours, 8:30 AM to 5:00 PM Monday through Friday,  call [phone].  After hours and on weekends, please call the GI answering service at [phone] who will take a message and have the physician on call contact you.   Following lower endoscopy (colonoscopy or flexible sigmoidoscopy):  Excessive amounts of blood in the stool  Significant tenderness or worsening of abdominal pains  Swelling of the abdomen that is new, acute  Fever of 100F or higher  FOLLOW UP: If any biopsies were taken you will be contacted by phone or by letter within the next 1-3 weeks.  Call your gastroenterologist if you have not heard about the biopsies in 3 weeks.  Our staff will call the home number listed on your records the next business day following your procedure to check on you and address any questions or concerns that you may have at that time regarding the information given to you following your procedure. This is a courtesy call and so if there is no answer at the home number and we have not heard from you through the emergency physician on call, we will assume that you have returned to your regular daily activities without incident.  SIGNATURES/CONFIDENTIALITY: You and/or your care partner have signed paperwork which will be entered into your electronic medical record.  These signatures attest to the fact that that the information above on your After Visit Summary has been reviewed and is understood.  Full responsibility of the confidentiality of this   discharge information lies with you and/or your care-partner.  Resume medications. 

## 2013-11-07 NOTE — Progress Notes (Signed)
Procedure ends, to recovery, report given and VSS. 

## 2013-11-08 ENCOUNTER — Telehealth: Payer: Self-pay

## 2013-11-08 NOTE — Telephone Encounter (Signed)
  Follow up Call-  Call back number 11/07/2013  Post procedure Call Back phone  # [phone] work  Permission to leave phone message Yes     Patient questions:  Do you have a fever, pain , or abdominal swelling? No. Pain Score  0 *  Have you tolerated food without any problems? Yes.    Have you been able to return to your normal activities? Yes.    Do you have any questions about your discharge instructions: Diet   No. Medications  No. Follow up visit  No.  Do you have questions or concerns about your Care? No.  Actions: * If pain score is 4 or above: No action needed, pain <4.  No problems per the pt. maw

## 2014-03-09 ENCOUNTER — Other Ambulatory Visit: Payer: Self-pay | Admitting: *Deleted

## 2014-03-09 MED ORDER — ALBUTEROL SULFATE HFA 108 (90 BASE) MCG/ACT IN AERS
1.0000 | INHALATION_SPRAY | RESPIRATORY_TRACT | Status: DC | PRN
Start: 2014-03-09 — End: 2014-08-23

## 2014-08-23 ENCOUNTER — Other Ambulatory Visit: Payer: Self-pay | Admitting: *Deleted

## 2014-08-23 ENCOUNTER — Other Ambulatory Visit: Payer: Self-pay | Admitting: Internal Medicine

## 2014-09-29 ENCOUNTER — Encounter: Payer: Self-pay | Admitting: Internal Medicine

## 2014-10-12 ENCOUNTER — Encounter: Payer: Self-pay | Admitting: Internal Medicine

## 2014-11-10 ENCOUNTER — Ambulatory Visit (INDEPENDENT_AMBULATORY_CARE_PROVIDER_SITE_OTHER): Payer: BLUE CROSS/BLUE SHIELD | Admitting: Internal Medicine

## 2014-11-10 ENCOUNTER — Encounter: Payer: Self-pay | Admitting: Internal Medicine

## 2014-11-10 VITALS — BP 120/80 | HR 80 | Temp 97.7°F | Resp 16 | Ht 68.0 in | Wt 157.0 lb

## 2014-11-10 DIAGNOSIS — Z6823 Body mass index (BMI) 23.0-23.9, adult: Secondary | ICD-10-CM

## 2014-11-10 DIAGNOSIS — Z0001 Encounter for general adult medical examination with abnormal findings: Secondary | ICD-10-CM | POA: Diagnosis not present

## 2014-11-10 DIAGNOSIS — E559 Vitamin D deficiency, unspecified: Secondary | ICD-10-CM | POA: Diagnosis not present

## 2014-11-10 DIAGNOSIS — R030 Elevated blood-pressure reading, without diagnosis of hypertension: Secondary | ICD-10-CM

## 2014-11-10 DIAGNOSIS — Z79899 Other long term (current) drug therapy: Secondary | ICD-10-CM | POA: Diagnosis not present

## 2014-11-10 DIAGNOSIS — R7303 Prediabetes: Secondary | ICD-10-CM | POA: Insufficient documentation

## 2014-11-10 DIAGNOSIS — R7309 Other abnormal glucose: Secondary | ICD-10-CM

## 2014-11-10 DIAGNOSIS — Z125 Encounter for screening for malignant neoplasm of prostate: Secondary | ICD-10-CM | POA: Diagnosis not present

## 2014-11-10 DIAGNOSIS — R5383 Other fatigue: Secondary | ICD-10-CM

## 2014-11-10 DIAGNOSIS — E7800 Pure hypercholesterolemia, unspecified: Secondary | ICD-10-CM

## 2014-11-10 DIAGNOSIS — Z1212 Encounter for screening for malignant neoplasm of rectum: Secondary | ICD-10-CM

## 2014-11-10 DIAGNOSIS — IMO0001 Reserved for inherently not codable concepts without codable children: Secondary | ICD-10-CM | POA: Insufficient documentation

## 2014-11-10 LAB — CBC WITH DIFFERENTIAL/PLATELET
BASOS ABS: 0 10*3/uL (ref 0.0–0.1)
Basophils Relative: 0 % (ref 0–1)
Eosinophils Absolute: 0.1 10*3/uL (ref 0.0–0.7)
Eosinophils Relative: 1 % (ref 0–5)
HEMATOCRIT: 47.5 % (ref 39.0–52.0)
HEMOGLOBIN: 16.2 g/dL (ref 13.0–17.0)
LYMPHS PCT: 23 % (ref 12–46)
Lymphs Abs: 1.9 10*3/uL (ref 0.7–4.0)
MCH: 31.2 pg (ref 26.0–34.0)
MCHC: 34.1 g/dL (ref 30.0–36.0)
MCV: 91.5 fL (ref 78.0–100.0)
MPV: 10.4 fL (ref 8.6–12.4)
Monocytes Absolute: 0.6 10*3/uL (ref 0.1–1.0)
Monocytes Relative: 7 % (ref 3–12)
NEUTROS ABS: 5.6 10*3/uL (ref 1.7–7.7)
Neutrophils Relative %: 69 % (ref 43–77)
Platelets: 260 10*3/uL (ref 150–400)
RBC: 5.19 MIL/uL (ref 4.22–5.81)
RDW: 14.5 % (ref 11.5–15.5)
WBC: 8.1 10*3/uL (ref 4.0–10.5)

## 2014-11-10 LAB — BASIC METABOLIC PANEL WITH GFR
BUN: 14 mg/dL (ref 7–25)
CO2: 23 mmol/L (ref 20–31)
Calcium: 8.9 mg/dL (ref 8.6–10.3)
Chloride: 104 mmol/L (ref 98–110)
Creat: 1.06 mg/dL (ref 0.70–1.33)
GFR, EST NON AFRICAN AMERICAN: 79 mL/min (ref >=60)
GLUCOSE: 90 mg/dL (ref 65–99)
POTASSIUM: 4.1 mmol/L (ref 3.5–5.3)
Sodium: 138 mmol/L (ref 135–146)

## 2014-11-10 LAB — HEMOGLOBIN A1C
Hgb A1c MFr Bld: 6.1 % — ABNORMAL HIGH (ref <5.7)
MEAN PLASMA GLUCOSE: 128 mg/dL — AB (ref <117)

## 2014-11-10 LAB — LIPID PANEL
CHOL/HDL RATIO: 2.5 ratio (ref <=5.0)
Cholesterol: 189 mg/dL (ref 125–200)
HDL: 77 mg/dL (ref >=40)
LDL CALC: 100 mg/dL (ref <130)
Triglycerides: 59 mg/dL (ref <150)
VLDL: 12 mg/dL (ref <30)

## 2014-11-10 LAB — HEPATIC FUNCTION PANEL
ALK PHOS: 38 U/L — AB (ref 40–115)
ALT: 18 U/L (ref 9–46)
AST: 22 U/L (ref 10–35)
Albumin: 4.3 g/dL (ref 3.6–5.1)
BILIRUBIN INDIRECT: 0.5 mg/dL (ref 0.2–1.2)
Bilirubin, Direct: 0.1 mg/dL (ref <=0.2)
TOTAL PROTEIN: 6.4 g/dL (ref 6.1–8.1)
Total Bilirubin: 0.6 mg/dL (ref 0.2–1.2)

## 2014-11-10 LAB — IRON AND TIBC
%SAT: 25 % (ref 15–60)
Iron: 82 ug/dL (ref 50–180)
TIBC: 322 ug/dL (ref 250–425)
UIBC: 240 ug/dL (ref 125–400)

## 2014-11-10 LAB — TSH: TSH: 1.372 u[IU]/mL (ref 0.350–4.500)

## 2014-11-10 LAB — VITAMIN B12: Vitamin B-12: 463 pg/mL (ref 211–911)

## 2014-11-10 LAB — MAGNESIUM: Magnesium: 2 mg/dL (ref 1.5–2.5)

## 2014-11-10 NOTE — Patient Instructions (Signed)

## 2014-11-11 LAB — PSA: PSA: 0.36 ng/mL (ref <=4.00)

## 2014-11-11 LAB — TESTOSTERONE: Testosterone: 277 ng/dL — ABNORMAL LOW (ref 300–890)

## 2014-11-11 LAB — VITAMIN D 25 HYDROXY (VIT D DEFICIENCY, FRACTURES): Vit D, 25-Hydroxy: 45 ng/mL (ref 30–100)

## 2014-11-11 LAB — INSULIN, RANDOM: Insulin: 6.2 u[IU]/mL (ref 2.0–19.6)

## 2014-11-12 ENCOUNTER — Encounter: Payer: Self-pay | Admitting: Internal Medicine

## 2014-11-12 NOTE — Progress Notes (Signed)
Patient ID: Dakota Yang, male   DOB: March 20, 1960, 54 y.o.   MRN: [id number]   Preventative Visit & comprehensive evaluation &  Examination  This very nice 54 y.o.male presents for Preventative /Wellness visit and comprehensive evaluation and management.  Patient has been followed for Labile elevated BP, Prediabetes, Hyperlipidemia, and Vitamin D Deficiency. Patient does have hx/o allergic asthma & reports using his rescue inhaler about 2-3 x/day every other day.    Labile elevated BP predates to 2000 and patient has been monitored expectantly. Patient's BP has been controlled at home.Today's BP: 120/80 mmHg. Patient denies any cardiac symptoms as chest pain, palpitations, shortness of breath, dizziness or ankle swelling.   Patient's hyperlipidemia is controlled with diet. Patient denies myalgias or other medication SE's. Last lipids were at goal with Cholesterol  171; HDL 70; LDL 89; Triglycerides 70 in Aug 2015.     Patient has prediabetes since 2012 with A1c 5.7% and patient denies reactive hypoglycemic symptoms, visual blurring, diabetic polys or paresthesias. Last A1c was 5.8% in Aug 2015.   Finally, patient has history of Vitamin D Deficiency of 37 in 2008 and last vitamin D was 45 on 11/10/2014.    Medication Sig  . VITAMIN D  Take 4,000 Units daily.  . Multiple Vitamin  Take 1 tab daily.  . Protein powder   . VENTOLIN HFA   inhaler INHALE 1 TO 2 PUFFS  EVERY 4 HOURS AS NEEDED   . vitamin C  500 MG  Take 500 mg b daily.   Allergies  Allergen Reactions  . Trovan [Alatrofloxacin] Other (See Comments)    Light-headed feeling   Past Medical History  Diagnosis Date  . Allergy    Health Maintenance  Topic Date Due  . TETANUS/TDAP  10/11/1978  . INFLUENZA VACCINE  09/18/2014  . COLONOSCOPY  11/08/2023  . Hepatitis C Screening  Completed  . HIV Screening  Completed   Immunization History  Administered Date(s) Administered  . DTaP 03/21/2008  . PPD Test 09/27/2013  .  Pneumococcal-Unspecified 12/18/1989   Past Surgical History  Procedure Laterality Date  . Tonsillectomy      age 34  . Wisdom teeth     Family History  Problem Relation Age of Onset  . Heart disease Mother   . Diabetes Father   . Hypertension Father   . Colon cancer Neg Hx   . Esophageal cancer Neg Hx   . Rectal cancer Neg Hx   . Stomach cancer Neg Hx   . Pancreatic cancer Neg Hx   . Prostate cancer Neg Hx    Social History   Social History  . Marital Status: Married    Spouse Name: N/A  . Number of Children: N/A  . Years of Education: N/A   Occupational History  . 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 Smoking status: Current Every Day Smoker -- 0.50 packs/day    Types: Cigarettes  . Smokeless tobacco: Former Neurosurgeon  . Alcohol Use: 5.0 oz/week    10 drink(s) per week     Comment: weekends  . Drug Use: No  . Sexual Activity: Active    ROS Constitutional: Denies fever, chills, weight loss/gain, headaches, insomnia,  night sweats or change in appetite. Does c/o fatigue. Eyes: Denies redness, blurred vision, diplopia, discharge, itchy or watery eyes.  ENT: Denies discharge, congestion, post nasal drip, epistaxis, sore throat, earache, hearing loss, dental pain, Tinnitus, Vertigo, Sinus pain or snoring.  Cardio: Denies chest pain, palpitations, irregular heartbeat, syncope, dyspnea, diaphoresis, orthopnea, PND, claudication or edema Respiratory: denies cough, dyspnea, DOE, pleurisy, hoarseness, laryngitis or wheezing.  Gastrointestinal: Denies dysphagia, heartburn, reflux, water brash, pain, cramps, nausea, vomiting, bloating, diarrhea, constipation, hematemesis, melena, hematochezia, jaundice or hemorrhoids Genitourinary: Denies dysuria, frequency, urgency, nocturia, hesitancy, discharge, hematuria or flank pain Musculoskeletal: Denies arthralgia, myalgia, stiffness, Jt. Swelling, pain, limp or strain/sprain. Denies Falls. Skin: Denies  puritis, rash, hives, warts, acne, eczema or change in skin lesion Neuro: No weakness, tremor, incoordination, spasms, paresthesia or pain Psychiatric: Denies confusion, memory loss or sensory loss. Denies Depression. Endocrine: Denies change in weight, skin, hair change, nocturia, and paresthesia, diabetic polys, visual blurring or hyper / hypo glycemic episodes.  Heme/Lymph: No excessive bleeding, bruising or enlarged lymph nodes.  Physical Exam  BP 120/80 mmHg  Pulse 80  Temp(Src) 97.7 F (36.5 C)  Resp 16  Ht  (1.727 m)  Wt 157 lb (71.215 kg)  BMI 23.88 kg/m2  General Appearance: Well nourished &  in no apparent distress. Eyes: PERRLA, EOMs, conjunctiva no swelling or erythema, normal fundi and vessels. Sinuses: No frontal/maxillary tenderness ENT/Mouth: EACs patent / TMs  nl. Nares clear without erythema, swelling, mucoid exudates. Oral hygiene is good. No erythema, swelling, or exudate. Tongue normal, non-obstructing. Tonsils not swollen or erythematous. Hearing normal.  Neck: Supple, thyroid normal. No bruits, nodes or JVD. Respiratory: Respiratory effort normal.  BS equal and clear bilateral without rales, rhonci, wheezing or stridor. Cardio: Heart sounds are normal with regular rate and rhythm and no murmurs, rubs or gallops. Peripheral pulses are normal and equal bilaterally without edema. No aortic or femoral bruits. Chest: symmetric with normal excursions and percussion.  Abdomen: Flat, soft, with bowel sounds. Nontender, no guarding, rebound, hernias, masses, or organomegaly.  Lymphatics: Non tender without lymphadenopathy.  Genitourinary: No hernias.Testes nl. DRE - prostate nl for age - smooth & firm w/o nodules. Musculoskeletal: Full ROM all peripheral extremities, joint stability, 5/5 strength, and normal gait. Skin: Warm and dry without rashes, lesions, cyanosis, clubbing or  ecchymosis.  Neuro: Cranial nerves intact, reflexes equal bilaterally. Normal muscle  tone, no cerebellar symptoms. Sensation intact.  Pysch: Alert and oriented X 3 with normal affect, insight and judgment appropriate.   Assessment and Plan  1. Encounter for general adult medical examination with abnormal findings   2. Elevated BP, screening  - EKG 12-Lead - [place], RETROPERITNL ABD,  LTD - TSH - DG Chest 2 View; Future  3. Elevated cholesterol, screening  - Lipid panel  4. Prediabetes  - Hemoglobin A1c - Insulin, random  5. Vitamin D deficiency  - Vit D  25 hydroxy   6. Screening for rectal cancer  - POC Hemoccult Bld/Stl   7. Prostate cancer screening  - PSA  8. Other fatigue  - Testosterone - Iron and TIBC - Vitamin B12 - TSH  9. Medication management  - CBC with Differential/Platelet - BASIC METABOLIC PANEL WITH GFR - Hepatic function panel - Magnesium  10. Body mass index (BMI) of 23.0-23.9 in adult   11. Asthma, allergic  - Sx's Asthmanex MDI & Twisthaler and a spacer to use with the MDI's.    Continue prudent diet as discussed, weight control, BP monitoring, regular exercise, and medications as discussed.  Discussed med effects and SE's. Routine screening labs and tests as requested with regular follow-up as recommended.  Over 40 minutes of exam, counseling &  chart review was performed

## 2014-11-15 ENCOUNTER — Telehealth: Payer: Self-pay | Admitting: *Deleted

## 2014-11-15 NOTE — Telephone Encounter (Signed)
Patient was called to remind patient to go for his chest x-ray per Dr Oneta Rack.  Left a message on his machine to remind him.

## 2014-11-16 ENCOUNTER — Ambulatory Visit (HOSPITAL_COMMUNITY)
Admission: RE | Admit: 2014-11-16 | Discharge: 2014-11-16 | Disposition: A | Discharge status: Home / Self Care | Payer: BLUE CROSS/BLUE SHIELD | Source: Ambulatory Visit | Attending: Internal Medicine | Admitting: Internal Medicine

## 2014-11-16 DIAGNOSIS — R030 Elevated blood-pressure reading, without diagnosis of hypertension: Secondary | ICD-10-CM | POA: Insufficient documentation

## 2014-11-16 DIAGNOSIS — IMO0001 Reserved for inherently not codable concepts without codable children: Secondary | ICD-10-CM

## 2014-11-17 ENCOUNTER — Other Ambulatory Visit: Payer: Self-pay | Admitting: *Deleted

## 2014-11-17 MED ORDER — MOMETASONE FUROATE 220 MCG/INH IN AEPB: 2.0000 | INHALATION_SPRAY | Freq: Every day | RESPIRATORY_TRACT | Status: DC

## 2014-11-17 MED ORDER — ALBUTEROL SULFATE HFA 108 (90 BASE) MCG/ACT IN AERS: INHALATION_SPRAY | RESPIRATORY_TRACT | Status: DC

## 2015-01-31 ENCOUNTER — Other Ambulatory Visit: Payer: Self-pay | Admitting: Internal Medicine

## 2015-01-31 ENCOUNTER — Other Ambulatory Visit: Payer: Self-pay

## 2015-01-31 DIAGNOSIS — E559 Vitamin D deficiency, unspecified: Secondary | ICD-10-CM

## 2015-01-31 DIAGNOSIS — E349 Endocrine disorder, unspecified: Secondary | ICD-10-CM

## 2015-01-31 DIAGNOSIS — R7309 Other abnormal glucose: Secondary | ICD-10-CM

## 2015-02-01 ENCOUNTER — Other Ambulatory Visit: Payer: Self-pay

## 2015-03-29 ENCOUNTER — Telehealth: Payer: Self-pay | Admitting: *Deleted

## 2015-03-29 MED ORDER — TESTOSTERONE CYPIONATE 200 MG/ML IM SOLN: INTRAMUSCULAR | Status: DC

## 2015-03-29 NOTE — Telephone Encounter (Signed)
Patient called to discuss low testosterone levels.  Testosterone handout mailed to patient and he requested an RX for Testosterone Cypionate be sent to his pharmacy.  Per Dr Oneta Rack, RX sent in and start with 1 1/2 ml every 3 weeks and check his level prior to his third injection.

## 2015-04-16 ENCOUNTER — Ambulatory Visit: Payer: Self-pay

## 2015-06-18 ENCOUNTER — Other Ambulatory Visit: Payer: Self-pay

## 2015-08-12 ENCOUNTER — Encounter: Payer: Self-pay | Admitting: *Deleted

## 2015-10-24 ENCOUNTER — Telehealth: Payer: Self-pay | Admitting: *Deleted

## 2015-10-24 NOTE — Telephone Encounter (Signed)
Patient called and asked for a recommendation for a supplement to take for ED.  Per Dr Oneta RackMcKeown, he suggest the patient try ordering Cialis from [email or web address]. The patient was informed to get the 20 mg tablets and take up to 1 every 3 days or just take a couple of days prior to intercourse.

## 2015-10-25 ENCOUNTER — Other Ambulatory Visit: Payer: Self-pay | Admitting: *Deleted

## 2015-10-25 MED ORDER — NEEDLES & SYRINGES MISC: 2 refills | Status: DC

## 2015-11-06 ENCOUNTER — Ambulatory Visit (INDEPENDENT_AMBULATORY_CARE_PROVIDER_SITE_OTHER): Payer: BLUE CROSS/BLUE SHIELD | Admitting: Internal Medicine

## 2015-11-06 ENCOUNTER — Encounter: Payer: Self-pay | Admitting: Internal Medicine

## 2015-11-06 VITALS — BP 112/64 | HR 76 | Temp 97.3°F | Resp 16 | Ht 68.0 in | Wt 165.0 lb

## 2015-11-06 DIAGNOSIS — E559 Vitamin D deficiency, unspecified: Secondary | ICD-10-CM | POA: Diagnosis not present

## 2015-11-06 DIAGNOSIS — Z125 Encounter for screening for malignant neoplasm of prostate: Secondary | ICD-10-CM | POA: Diagnosis not present

## 2015-11-06 DIAGNOSIS — R030 Elevated blood-pressure reading, without diagnosis of hypertension: Secondary | ICD-10-CM | POA: Diagnosis not present

## 2015-11-06 DIAGNOSIS — Z0001 Encounter for general adult medical examination with abnormal findings: Secondary | ICD-10-CM

## 2015-11-06 DIAGNOSIS — Z79899 Other long term (current) drug therapy: Secondary | ICD-10-CM

## 2015-11-06 DIAGNOSIS — R5383 Other fatigue: Secondary | ICD-10-CM

## 2015-11-06 DIAGNOSIS — Z0000 Encounter for general adult medical examination without abnormal findings: Secondary | ICD-10-CM | POA: Diagnosis not present

## 2015-11-06 DIAGNOSIS — Z136 Encounter for screening for cardiovascular disorders: Secondary | ICD-10-CM | POA: Diagnosis not present

## 2015-11-06 DIAGNOSIS — Z87891 Personal history of nicotine dependence: Secondary | ICD-10-CM

## 2015-11-06 DIAGNOSIS — Z111 Encounter for screening for respiratory tuberculosis: Secondary | ICD-10-CM

## 2015-11-06 DIAGNOSIS — E7800 Pure hypercholesterolemia, unspecified: Secondary | ICD-10-CM

## 2015-11-06 DIAGNOSIS — R7303 Prediabetes: Secondary | ICD-10-CM

## 2015-11-06 DIAGNOSIS — E349 Endocrine disorder, unspecified: Secondary | ICD-10-CM

## 2015-11-06 DIAGNOSIS — IMO0001 Reserved for inherently not codable concepts without codable children: Secondary | ICD-10-CM

## 2015-11-06 DIAGNOSIS — Z1212 Encounter for screening for malignant neoplasm of rectum: Secondary | ICD-10-CM

## 2015-11-06 LAB — BASIC METABOLIC PANEL WITH GFR
BUN: 14 mg/dL (ref 7–25)
CO2: 23 mmol/L (ref 20–31)
Calcium: 9 mg/dL (ref 8.6–10.3)
Chloride: 103 mmol/L (ref 98–110)
Creat: 1.28 mg/dL (ref 0.70–1.33)
GFR, EST AFRICAN AMERICAN: 72 mL/min (ref >=60)
GFR, EST NON AFRICAN AMERICAN: 62 mL/min (ref >=60)
Glucose, Bld: 101 mg/dL — ABNORMAL HIGH (ref 65–99)
POTASSIUM: 4.1 mmol/L (ref 3.5–5.3)
SODIUM: 134 mmol/L — AB (ref 135–146)

## 2015-11-06 LAB — IRON AND TIBC
%SAT: 21 % (ref 15–60)
IRON: 61 ug/dL (ref 50–180)
TIBC: 292 ug/dL (ref 250–425)
UIBC: 231 ug/dL (ref 125–400)

## 2015-11-06 LAB — CBC WITH DIFFERENTIAL/PLATELET
BASOS PCT: 0 %
Basophils Absolute: 0 cells/uL (ref 0–200)
EOS PCT: 5 %
Eosinophils Absolute: 305 cells/uL (ref 15–500)
HEMATOCRIT: 52.3 % — AB (ref 38.5–50.0)
Hemoglobin: 17.5 g/dL — ABNORMAL HIGH (ref 13.2–17.1)
LYMPHS PCT: 21 %
Lymphs Abs: 1281 cells/uL (ref 850–3900)
MCH: 31.3 pg (ref 27.0–33.0)
MCHC: 33.5 g/dL (ref 32.0–36.0)
MCV: 93.4 fL (ref 80.0–100.0)
MONO ABS: 671 {cells}/uL (ref 200–950)
MPV: 11.1 fL (ref 7.5–12.5)
Monocytes Relative: 11 %
Neutro Abs: 3843 cells/uL (ref 1500–7800)
Neutrophils Relative %: 63 %
PLATELETS: 243 10*3/uL (ref 140–400)
RBC: 5.6 MIL/uL (ref 4.20–5.80)
RDW: 14.4 % (ref 11.0–15.0)
WBC: 6.1 10*3/uL (ref 3.8–10.8)

## 2015-11-06 LAB — HEPATIC FUNCTION PANEL
ALK PHOS: 36 U/L — AB (ref 40–115)
ALT: 20 U/L (ref 9–46)
AST: 25 U/L (ref 10–35)
Albumin: 4 g/dL (ref 3.6–5.1)
BILIRUBIN DIRECT: 0.1 mg/dL (ref <=0.2)
BILIRUBIN INDIRECT: 0.3 mg/dL (ref 0.2–1.2)
BILIRUBIN TOTAL: 0.4 mg/dL (ref 0.2–1.2)
Total Protein: 6.1 g/dL (ref 6.1–8.1)

## 2015-11-06 LAB — MAGNESIUM: Magnesium: 2.2 mg/dL (ref 1.5–2.5)

## 2015-11-06 LAB — LIPID PANEL
CHOLESTEROL: 158 mg/dL (ref 125–200)
HDL: 52 mg/dL (ref >=40)
LDL CALC: 76 mg/dL (ref <130)
TRIGLYCERIDES: 152 mg/dL — AB (ref <150)
Total CHOL/HDL Ratio: 3 Ratio (ref <=5.0)
VLDL: 30 mg/dL (ref <30)

## 2015-11-06 LAB — TSH: TSH: 0.87 mIU/L (ref 0.40–4.50)

## 2015-11-06 LAB — VITAMIN B12: Vitamin B-12: 433 pg/mL (ref 200–1100)

## 2015-11-06 LAB — PSA: PSA: 0.3 ng/mL (ref <=4.0)

## 2015-11-06 NOTE — Progress Notes (Signed)
[facility]   Dakota Yang, M.D.    [name]. [name], P.A.-C      [name], P.A.-C  [facility]                [address]                [place], [place]. [address] Telephone [phone] Telefax [phone] Annual  Screening/Preventative Visit  & Comprehensive Evaluation & Examination     This very nice 55 y.o. MWM presents for a Screening/Preventative Visit & comprehensive evaluation and management of multiple medical co-morbidities.  Patient has been followed expectantly and screened  for HTN, Prediabetes, Hyperlipidemia and Vitamin D Deficiency.     Pat had elevated BP in 2000 and has since been expectantly monitored for HTN. Patient's BP has been controlled at home. Today's BP is  112/64. Patient denies any cardiac symptoms as chest pain, palpitations, shortness of breath, dizziness or ankle swelling.     Patient's hyperlipidemia is controlled with diet. Current lipids are at goal: Lab Results  Component Value Date   CHOL 158 11/06/2015   HDL 52 11/06/2015   LDLCALC 76 11/06/2015   TRIG 152 (H) 11/06/2015   CHOLHDL 3.0 11/06/2015      Patient has prediabetes with A1c 5.7%  since 2012 and patient denies reactive hypoglycemic symptoms, visual blurring, diabetic polys or paresthesias. Current  A1c is still not at goal: Lab Results  Component Value Date   HGBA1C 6.1 (H) 11/10/2014       Patient also ha Low T & has been on parenteral replacement. Finally, patient has history of Vitamin D Deficiency of "39" in 2008 and current vitamin D is still low : Lab Results  Component Value Date   VD25OH 45 11/10/2014   Current Outpatient Prescriptions on File Prior to Visit  Medication Sig  . VENTOLIN HFA  inhaler INHALE 1 TO 2 PUFF(s)  EVERY 4 HRS AS NEEDED  . VITAMIN D Take 4,000 Units by mouth daily.  . Multiple Vitamin  Take 1 tablet by mouth daily.  . OTC Protein powder  . testosterone cypio 200  MG/ML inj Inject 2 ml IM every 2 weeks or as directed.  . vitamin C  500 MG tablet Take 500 mg by mouth daily.   Allergies  Allergen Reactions  . Trovan [Alatrofloxacin] Other (See Comments)    Light-headed feeling   Past Medical History:  Diagnosis Date  . Allergy    Health Maintenance  Topic Date Due  . TETANUS/TDAP  10/11/1978  . INFLUENZA VACCINE  09/18/2015  . COLONOSCOPY  11/08/2023  . Hepatitis C Screening  Completed  . HIV Screening  Completed   Immunization History  Administered Date(s) Administered  . DTaP 03/21/2008  . PPD Test 09/27/2013, 11/06/2015  . Pneumococcal-Unspecified 12/18/1989   Past Surgical History:  Procedure Laterality Date  . TONSILLECTOMY     age 55  . wisdom teeth     Family History  Problem Relation Age of Onset  . Heart disease Mother   . Diabetes Father   . Hypertension Father   . Colon cancer Neg Hx   . Esophageal cancer Neg Hx   . Rectal cancer Neg Hx   . Stomach cancer Neg Hx   . Pancreatic cancer Neg Hx   . Prostate cancer Neg Hx    Social History   Social History  . Marital status: Married    Spouse name: N/A  .  Number of children: N/A  . Years of education: N/A   Occupational History  . Chemist   Social History Main Topics  . Smoking status: Current Every Day Smoker    Packs/day: 0.50    Types: Cigarettes  . Smokeless tobacco: Former Neurosurgeon  . Alcohol use 5.0 oz/week    10 drink(s) per week     Comment: weekends  . Drug use: No  . Sexual activity: Active    ROS Constitutional: Denies fever, chills, weight loss/gain, headaches, insomnia,  night sweats or change in appetite. Does c/o fatigue. Eyes: Denies redness, blurred vision, diplopia, discharge, itchy or watery eyes.  ENT: Denies discharge, congestion, post nasal drip, epistaxis, sore throat, earache, hearing loss, dental pain, Tinnitus, Vertigo, Sinus pain or snoring.  Cardio: Denies chest pain, palpitations, irregular heartbeat, syncope, dyspnea,  diaphoresis, orthopnea, PND, claudication or edema Respiratory: denies cough, dyspnea, DOE, pleurisy, hoarseness, laryngitis or wheezing.  Gastrointestinal: Denies dysphagia, heartburn, reflux, water brash, pain, cramps, nausea, vomiting, bloating, diarrhea, constipation, hematemesis, melena, hematochezia, jaundice or hemorrhoids Genitourinary: Denies dysuria, frequency, urgency, nocturia, hesitancy, discharge, hematuria or flank pain Musculoskeletal: Denies arthralgia, myalgia, stiffness, Jt. Swelling, pain, limp or strain/sprain. Denies Falls. Skin: Denies puritis, rash, hives, warts, acne, eczema or change in skin lesion Neuro: No weakness, tremor, incoordination, spasms, paresthesia or pain Psychiatric: Denies confusion, memory loss or sensory loss. Denies Depression. Endocrine: Denies change in weight, skin, hair change, nocturia, and paresthesia, diabetic polys, visual blurring or hyper / hypo glycemic episodes.  Heme/Lymph: No excessive bleeding, bruising or enlarged lymph nodes.  Physical Exam  BP 112/64   Pulse 76   Temp 97.3 F (36.3 C)   Resp 16   Ht 5\' 8"  (1.727 m)   Wt 165 lb (74.8 kg)   BMI 25.09 kg/m   General Appearance: Well nourished, in no apparent distress.  Eyes: PERRLA, EOMs, conjunctiva no swelling or erythema, normal fundi and vessels. Sinuses: No frontal/maxillary tenderness ENT/Mouth: EACs patent / TMs  nl. Nares clear without erythema, swelling, mucoid exudates. Oral hygiene is good. No erythema, swelling, or exudate. Tongue normal, non-obstructing. Tonsils not swollen or erythematous. Hearing normal.  Neck: Supple, thyroid normal. No bruits, nodes or JVD. Respiratory: Respiratory effort normal.  BS equal and clear bilateral without rales, rhonci, wheezing or stridor. Cardio: Heart sounds are normal with regular rate and rhythm and no murmurs, rubs or gallops. Peripheral pulses are normal and equal bilaterally without edema. No aortic or femoral  bruits. Chest: symmetric with normal excursions and percussion.  Abdomen: Soft, with Nl bowel sounds. Nontender, no guarding, rebound, hernias, masses, or organomegaly.  Lymphatics: Non tender without lymphadenopathy.  Genitourinary: No hernias.Testes nl. DRE - prostate nl for age - smooth & firm w/o nodules. Musculoskeletal: Full ROM all peripheral extremities, joint stability, 5/5 strength, and normal gait. Skin: Warm and dry without rashes, lesions, cyanosis, clubbing or  ecchymosis.  Neuro: Cranial nerves intact, reflexes equal bilaterally. Normal muscle tone, no cerebellar symptoms. Sensation intact.  Pysch: Alert and oriented X 3 with normal affect, insight and judgment appropriate.   Assessment and Plan  1. Annual Preventative/Screening Exam   - Microalbumin / creatinine urine ratio - EKG 12-Lead - [place], RETROPERITNL ABD,  LTD - POC Hemoccult Bld/Stl  - Urinalysis, Routine w reflex microscopic  - Vitamin B12 - Iron and TIBC - PSA - Testosterone - CBC with Differential/Platelet - BASIC METABOLIC PANEL WITH GFR - Hepatic function panel - Magnesium - Lipid panel - TSH - Hemoglobin A1c - Insulin,  random - VITAMIN D 25 Hydroxy   2. Elevated BP, screening  - Microalbumin / creatinine urine ratio - EKG 12-Lead - US, RETROPERITNL ABD,  LTD - TSH - DG Chest 2 View; Future  3. Elevated cholesterol, screening  - EKG 12-Lead - US, RETROPERITNL ABD,  LTD - Lipid panel - TSH  4. Prediabetes  - EKG 12-Lead - US, RETROPERITNL ABD,  LTD - Hemoglobin A1c - Insulin, random  5. Vitamin D deficiency  - VITAMIN D 25 Hydroxy   6. Testosterone deficiency  - Testosterone  7. Screening for rectal cancer  - POC Hemoccult Bld/Stl   8. Prostate cancer screening  - PSA  9. Screening for ischemic heart disease  - EKG 12-Lead  10. Screening for AAA (aortic abdominal aneurysm)  - US, RETROPERITNL ABD,  LTD  11. Other fatigue  - Vitamin B12 - Iron and TIBC -  Testosterone - CBC with Differential/Platelet - TSH  12. Medication management  - Urinalysis, Routine w reflex microscopic  - CBC with Differential/Platelet - BASIC METABOLIC PANEL WITH GFR - Hepatic function panel - Magnesium  13. Screening examination for pulmonary tuberculosis  - PPD  14. Smoking hx  - DG Chest 2 View; Future      Continue prudent diet as discussed, weight control, BP monitoring, regular exercise, and medications as discussed.  Discussed med effects and SE's. Routine screening labs and tests as requested with regular follow-up as recommended. Over 40 minutes of exam, counseling, chart review and high complex critical decision making was performed

## 2015-11-06 NOTE — Patient Instructions (Signed)

## 2015-11-07 ENCOUNTER — Ambulatory Visit (HOSPITAL_COMMUNITY)
Admission: RE | Admit: 2015-11-07 | Discharge: 2015-11-07 | Disposition: A | Discharge status: Home / Self Care | Payer: BLUE CROSS/BLUE SHIELD | Source: Ambulatory Visit | Attending: Internal Medicine | Admitting: Internal Medicine

## 2015-11-07 DIAGNOSIS — Z87891 Personal history of nicotine dependence: Secondary | ICD-10-CM | POA: Diagnosis not present

## 2015-11-07 DIAGNOSIS — I10 Essential (primary) hypertension: Secondary | ICD-10-CM | POA: Insufficient documentation

## 2015-11-07 DIAGNOSIS — R030 Elevated blood-pressure reading, without diagnosis of hypertension: Secondary | ICD-10-CM

## 2015-11-07 DIAGNOSIS — IMO0001 Reserved for inherently not codable concepts without codable children: Secondary | ICD-10-CM

## 2015-11-07 LAB — HEMOGLOBIN A1C
Hgb A1c MFr Bld: 5.4 % (ref <5.7)
Mean Plasma Glucose: 108 mg/dL

## 2015-11-07 LAB — URINALYSIS, ROUTINE W REFLEX MICROSCOPIC
Bilirubin Urine: NEGATIVE
GLUCOSE, UA: NEGATIVE
HGB URINE DIPSTICK: NEGATIVE
Ketones, ur: NEGATIVE
LEUKOCYTES UA: NEGATIVE
NITRITE: NEGATIVE
PROTEIN: NEGATIVE
Specific Gravity, Urine: 1.02 (ref 1.001–1.035)
pH: 5.5 (ref 5.0–8.0)

## 2015-11-07 LAB — MICROALBUMIN / CREATININE URINE RATIO
Creatinine, Urine: 154 mg/dL (ref 20–370)
MICROALB UR: 0.3 mg/dL
MICROALB/CREAT RATIO: 2 ug/mg{creat} (ref <30)

## 2015-11-07 LAB — INSULIN, RANDOM: Insulin: 14.5 u[IU]/mL (ref 2.0–19.6)

## 2015-11-07 LAB — TESTOSTERONE: Testosterone: 675 ng/dL (ref 250–827)

## 2015-11-07 LAB — VITAMIN D 25 HYDROXY (VIT D DEFICIENCY, FRACTURES): Vit D, 25-Hydroxy: 58 ng/mL (ref 30–100)

## 2015-11-08 LAB — TB SKIN TEST
Induration: 0 mm
TB Skin Test: NEGATIVE

## 2015-11-29 ENCOUNTER — Other Ambulatory Visit: Payer: Self-pay | Admitting: *Deleted

## 2015-11-29 DIAGNOSIS — Z0001 Encounter for general adult medical examination with abnormal findings: Secondary | ICD-10-CM

## 2015-11-29 DIAGNOSIS — Z1212 Encounter for screening for malignant neoplasm of rectum: Secondary | ICD-10-CM

## 2015-11-29 LAB — POC HEMOCCULT BLD/STL (HOME/3-CARD/SCREEN)
Card #2 Fecal Occult Blod, POC: NEGATIVE
FECAL OCCULT BLD: NEGATIVE
FECAL OCCULT BLD: NEGATIVE

## 2015-12-07 ENCOUNTER — Encounter: Payer: Self-pay | Admitting: Internal Medicine

## 2015-12-25 ENCOUNTER — Other Ambulatory Visit: Payer: Self-pay | Admitting: Internal Medicine

## 2015-12-25 DIAGNOSIS — J4520 Mild intermittent asthma, uncomplicated: Secondary | ICD-10-CM

## 2015-12-25 MED ORDER — ALBUTEROL SULFATE HFA 108 (90 BASE) MCG/ACT IN AERS: INHALATION_SPRAY | RESPIRATORY_TRACT | 12 refills | Status: DC

## 2016-01-22 ENCOUNTER — Other Ambulatory Visit: Payer: Self-pay | Admitting: Internal Medicine

## 2016-03-07 ENCOUNTER — Other Ambulatory Visit: Payer: Self-pay | Admitting: Internal Medicine

## 2016-03-07 MED ORDER — PREDNISONE 20 MG PO TABS: ORAL_TABLET | ORAL | 2 refills | Status: DC

## 2016-03-10 ENCOUNTER — Encounter: Payer: Self-pay | Admitting: Internal Medicine

## 2016-03-10 ENCOUNTER — Ambulatory Visit (INDEPENDENT_AMBULATORY_CARE_PROVIDER_SITE_OTHER): Payer: BLUE CROSS/BLUE SHIELD | Admitting: Internal Medicine

## 2016-03-10 VITALS — BP 120/74 | HR 76 | Temp 97.3°F | Resp 16 | Ht 68.0 in | Wt 153.8 lb

## 2016-03-10 DIAGNOSIS — J4520 Mild intermittent asthma, uncomplicated: Secondary | ICD-10-CM | POA: Diagnosis not present

## 2016-03-10 DIAGNOSIS — J042 Acute laryngotracheitis: Secondary | ICD-10-CM | POA: Diagnosis not present

## 2016-03-10 MED ORDER — PREDNISONE 20 MG PO TABS: ORAL_TABLET | ORAL | 0 refills | Status: DC

## 2016-03-10 MED ORDER — AZITHROMYCIN 250 MG PO TABS: ORAL_TABLET | ORAL | 1 refills | Status: DC

## 2016-03-10 NOTE — Progress Notes (Signed)
[facility]   Lucky CowboyWilliam Gemayel Yang, M.D.    Dyanne CarrelAmanda R. Steffanie Dunnollier, P.A.-C      Terri Piedraourtney Forcucci, P.A.-C  [facility]                [address]                [place], [place] [id number] Telephone [phone] Telefax [phone]  Subjective:    Patient ID: Dakota CreekWilliam Scott Tonche, male    DOB: December 03, 1960, 56 y.o.   MRN: [id number]  HPI  Patient is a nice 56 yo MWM with hx/o allergic asthma who presents with a 3-4 day hx/o increasing chest congestion and wheezing refractory to his intermittent sporadic albuterol MDI use. Denies fever, chills and cough is productive of a greenish sputum.   Medication Sig  . albuterol HFA inhaler 1 to 2 inhalations 5 minutes apart every 4 hours if needed to rescue asthma  . VITAMIN D Take 4,000 Units by mouth daily.  . Multiple Vitamin  Take 1 tablet by mouth daily.  . vitamin C 500 MG  Take 500 mg by mouth daily.  [name]. ASMANEX  220 Inhale 2 puffs into the lungs daily.  [name]. testosterone cypionate 200 MG/ML Inject 2 ml IM every 2 weeks or as directed.   Allergies  Allergen Reactions  . Trovan [Alatrofloxacin] Other (See Comments)    Light-headed feeling   Review of Systems  10 point systems review negative except as above.    Objective:   Physical Exam  BP 120/74   P76   T 97.3 F   R 16   Ht 5\' 8"     Wt 153 lb 12.8 oz   BMI 23.39   O2 sat 97% on Rm Air  Brassy, wheezy cough w/o respiratory stridor.   HEENT - Eac's patent. TM's Nl. EOM's full. PERRLA. NasoOroPharynx clear. Neck - supple. No JVD Chest - Scattered coarse inspiratory rales and expiratory rhonchi and wheezes. Cor - Nl HS. RRR w/o sig M.  MS- FROM w/o deformities. Muscle power, tone and bulk Nl. Gait Nl. Neuro - Nl w/o focal abnormalities.    Assessment & Plan:   1. Asthmatic Bronchitis    2. Laryngotracheitis  - azithromycin (ZITHROMAX) 250 MG tablet; Take 2 tablets (500 mg) on  Day 1,  followed by 1 tablet (250  mg) once daily on Days 2 through 5.  Dispense: 6 each; Refill: 1  - predniSONE (DELTASONE) 20 MG tablet; 1 tab 3 x day for 3 days, then 1 tab 2 x day for 3 days, then 1 tab 1 x day for 5 days  Dispense: 20 tablet; Refill: 0  - Sx Bevespi x 2 weeks and instructed in usage

## 2016-03-10 NOTE — Patient Instructions (Signed)
Asthma, Acute Bronchospasm °Acute bronchospasm caused by asthma is also referred to as an asthma attack. Bronchospasm means your air passages become narrowed. The narrowing is caused by inflammation and tightening of the muscles in the air tubes (bronchi) in your lungs. This can make it hard to breathe or cause you to wheeze and cough. °What are the causes? °Possible triggers are: °· Animal dander from the skin, hair, or feathers of animals. °· Dust mites contained in house dust. °· Cockroaches. °· Pollen from trees or grass. °· Mold. °· Cigarette or tobacco smoke. °· Air pollutants such as dust, household cleaners, hair sprays, aerosol sprays, paint fumes, strong chemicals, or strong odors. °· Cold air or weather changes. Cold air may trigger inflammation. Winds increase molds and pollens in the air. °· Strong emotions such as crying or laughing hard. °· Stress. °· Certain medicines such as aspirin or beta-blockers. °· Sulfites in foods and drinks, such as dried fruits and wine. °· Infections or inflammatory conditions, such as a flu, cold, or inflammation of the nasal membranes (rhinitis). °· Gastroesophageal reflux disease (GERD). GERD is a condition where stomach acid backs up into your esophagus. °· Exercise or strenuous activity. ° °What are the signs or symptoms? °· Wheezing. °· Excessive coughing, particularly at night. °· Chest tightness. °· Shortness of breath. °How is this diagnosed? °Your health care provider will ask you about your medical history and perform a physical exam. A chest X-ray or blood testing may be performed to look for other causes of your symptoms or other conditions that may have triggered your asthma attack. °How is this treated? °Treatment is aimed at reducing inflammation and opening up the airways in your lungs. Most asthma attacks are treated with inhaled medicines. These include quick relief or rescue medicines (such as bronchodilators) and controller medicines (such as inhaled  corticosteroids). These medicines are sometimes given through an inhaler or a nebulizer. Systemic steroid medicine taken by mouth or given through an IV tube also can be used to reduce the inflammation when an attack is moderate or severe. Antibiotic medicines are only used if a bacterial infection is present. °Follow these instructions at home: °· Rest. °· Drink plenty of liquids. This helps the mucus to remain thin and be easily coughed up. Only use caffeine in moderation and do not use alcohol until you have recovered from your illness. °· Do not smoke. Avoid being exposed to secondhand smoke. °· You play a critical role in keeping yourself in good health. Avoid exposure to things that cause you to wheeze or to have breathing problems. °· Keep your medicines up-to-date and available. Carefully follow your health care provider’s treatment plan. °· Take your medicine exactly as prescribed. °· When pollen or pollution is bad, keep windows closed and use an air conditioner or go to places with air conditioning. °· Asthma requires careful medical care. See your health care provider for a follow-up as advised. If you are more than [redacted] weeks pregnant and you were prescribed any new medicines, let your obstetrician know about the visit and how you are doing. Follow up with your health care provider as directed. °· After you have recovered from your asthma attack, make an appointment with your outpatient doctor to talk about ways to reduce the likelihood of future attacks. If you do not have a doctor who manages your asthma, make an appointment with a primary care doctor to discuss your asthma. °Get help right away if: °· You are getting worse. °·   You have trouble breathing. If severe, call your local emergency services (911 in the [place] °· You develop chest pain or discomfort. °· You are vomiting. °· You are not able to keep fluids down. °· You are coughing up yellow, green, brown, or bloody sputum. °· You have a fever  and your symptoms suddenly get worse. °· You have trouble swallowing. °This information is not intended to replace advice given to you by your health care provider. Make sure you discuss any questions you have with your health care provider. °Document Released: 05/21/2006 Document Revised: 07/18/2015 Document Reviewed: 08/11/2012 °Elsevier Interactive Patient Education © 2017 [employer] ° °

## 2016-03-17 ENCOUNTER — Other Ambulatory Visit: Payer: Self-pay | Admitting: *Deleted

## 2016-03-17 DIAGNOSIS — J042 Acute laryngotracheitis: Secondary | ICD-10-CM

## 2016-03-17 MED ORDER — PREDNISONE 20 MG PO TABS: ORAL_TABLET | ORAL | 0 refills | Status: DC

## 2016-03-17 MED ORDER — GLYCOPYRROLATE-FORMOTEROL 9-4.8 MCG/ACT IN AERO: 2.0000 g | INHALATION_SPRAY | Freq: Two times a day (BID) | RESPIRATORY_TRACT | 0 refills | Status: DC

## 2016-03-17 NOTE — Telephone Encounter (Signed)
Patient called and states he is feeling better, but still has some chest tightness and coughing up brownish colored mucus. The patient will refill the Z-pak and a refill has been sent in for his Prednisone 20 mg, per Dr Oneta RackMcKeown.  Patient aware.

## 2016-12-10 ENCOUNTER — Other Ambulatory Visit: Payer: Self-pay | Admitting: Internal Medicine

## 2016-12-23 ENCOUNTER — Ambulatory Visit (INDEPENDENT_AMBULATORY_CARE_PROVIDER_SITE_OTHER): Payer: BLUE CROSS/BLUE SHIELD | Admitting: Internal Medicine

## 2016-12-23 ENCOUNTER — Encounter: Payer: Self-pay | Admitting: Internal Medicine

## 2016-12-23 VITALS — BP 106/82 | HR 72 | Temp 97.7°F | Resp 18 | Ht 68.5 in | Wt 154.6 lb

## 2016-12-23 DIAGNOSIS — Z136 Encounter for screening for cardiovascular disorders: Secondary | ICD-10-CM

## 2016-12-23 DIAGNOSIS — Z79899 Other long term (current) drug therapy: Secondary | ICD-10-CM

## 2016-12-23 DIAGNOSIS — Z111 Encounter for screening for respiratory tuberculosis: Secondary | ICD-10-CM

## 2016-12-23 DIAGNOSIS — R5383 Other fatigue: Secondary | ICD-10-CM

## 2016-12-23 DIAGNOSIS — E559 Vitamin D deficiency, unspecified: Secondary | ICD-10-CM

## 2016-12-23 DIAGNOSIS — Z0000 Encounter for general adult medical examination without abnormal findings: Secondary | ICD-10-CM | POA: Diagnosis not present

## 2016-12-23 DIAGNOSIS — R0989 Other specified symptoms and signs involving the circulatory and respiratory systems: Secondary | ICD-10-CM

## 2016-12-23 DIAGNOSIS — R7303 Prediabetes: Secondary | ICD-10-CM

## 2016-12-23 DIAGNOSIS — Z125 Encounter for screening for malignant neoplasm of prostate: Secondary | ICD-10-CM

## 2016-12-23 DIAGNOSIS — Z1211 Encounter for screening for malignant neoplasm of colon: Secondary | ICD-10-CM

## 2016-12-23 DIAGNOSIS — E291 Testicular hypofunction: Secondary | ICD-10-CM

## 2016-12-23 DIAGNOSIS — E349 Endocrine disorder, unspecified: Secondary | ICD-10-CM

## 2016-12-23 DIAGNOSIS — F17200 Nicotine dependence, unspecified, uncomplicated: Secondary | ICD-10-CM

## 2016-12-23 DIAGNOSIS — E782 Mixed hyperlipidemia: Secondary | ICD-10-CM

## 2016-12-23 DIAGNOSIS — I10 Essential (primary) hypertension: Secondary | ICD-10-CM

## 2016-12-23 DIAGNOSIS — Z0001 Encounter for general adult medical examination with abnormal findings: Secondary | ICD-10-CM

## 2016-12-23 DIAGNOSIS — Z1212 Encounter for screening for malignant neoplasm of rectum: Secondary | ICD-10-CM

## 2016-12-23 NOTE — Patient Instructions (Signed)

## 2016-12-23 NOTE — Progress Notes (Signed)
[facility]   Dakota Yang, M.D.     Dyanne CarrelAmanda R. Steffanie Dunnollier, P.A.-C Judd GaudierAshley Corbett, DNP [facility]                [address]                [place], [place] [phone] Telephone [phone] Telefax [phone] Annual  Screening/Preventative Visit  & Comprehensive Evaluation & Examination     This very nice 56 y.o. MWM presents for a Screening/Preventative Visit & comprehensive evaluation and management of multiple medical co-morbidities.  Patient has been followed expectantly for HTN, Prediabetes, Hyperlipidemia and Vitamin D Deficiency. Patient also has Allergic Asthma to environmental pollens and has used sx's of Breo & Besivi occasionally .      Patient is followed expectantly for labile HTN since 2000. Patient's BP has been controlled at home.  Today's BP is at goal - 106/82. Patient denies any cardiac symptoms as chest pain, palpitations, shortness of breath, dizziness or ankle swelling.     Patient's lipids are controlled with diet. Last lipids were at goal albeit slightly elevated Trig's: Lab Results  Component Value Date   CHOL 158 11/06/2015   HDL 52 11/06/2015   LDLCALC 76 11/06/2015   TRIG 152 (H) 11/06/2015   CHOLHDL 3.0 11/06/2015      Patient has prediabetes (A1c 5.7% / 2012)  and patient denies reactive hypoglycemic symptoms, visual blurring, diabetic polys or paresthesias. Last A1c was normal and at goal: Lab Results  Component Value Date   HGBA1C 5.4 11/06/2015       Patient has hx/o Low Testosterone & had been on replacement therapy in the past. Finally, patient has history of Vitamin D Deficiency ("37" / 2008)  and last vitamin D was near goal (70-100): Lab Results  Component Value Date   VD25OH 58 11/06/2015   Current Outpatient Medications on File Prior to Visit  Medication Sig  . albuterol (VENTOLIN HFA) 108 (90 Base) MCG/ACT inhaler 1 to 2 inhalations 5 minutes apart every 4 hours if  needed to rescue asthma  . BREO ELLIPTA 100-25 MCG/INH AEPB INHALE ONE DOSE BY MOUTH DAILY  . Cholecalciferol (VITAMIN D PO) Take 4,000 Units by mouth daily.  . Multiple Vitamin (MULTIVITAMIN) tablet Take 1 tablet by mouth daily.  [name]. OVER THE COUNTER MEDICATION Protein powder  . predniSONE (DELTASONE) 20 MG tablet Take 20 mg daily with breakfast by mouth. Takes PRN  . vitamin C (ASCORBIC ACID) 500 MG tablet Take 500 mg by mouth daily.   No current facility-administered medications on file prior to visit.    Allergies  Allergen Reactions  . Trovan [Alatrofloxacin] Other (See Comments)    Light-headed feeling   Past Medical History:  Diagnosis Date  . Allergy    Health Maintenance  Topic Date Due  . TETANUS/TDAP  10/11/1978  . INFLUENZA VACCINE  09/17/2016  . COLONOSCOPY  11/08/2023  . Hepatitis C Screening  Completed  . HIV Screening  Completed   Immunization History  Administered Date(s) Administered  . DTaP 03/21/2008  . PPD Test 09/27/2013, 11/06/2015  . Pneumococcal-Unspecified 12/18/1989   Past Surgical History:  Procedure Laterality Date  . TONSILLECTOMY     age 56  . wisdom teeth     Family History  Problem Relation Age of Onset  . Heart disease Mother   . Diabetes Father   . Hypertension Father   . Colon cancer Neg Hx   .  Esophageal cancer Neg Hx   . Rectal cancer Neg Hx   . Stomach cancer Neg Hx   . Pancreatic cancer Neg Hx   . Prostate cancer Neg Hx    Social History   Socioeconomic History  . Marital status: Married  Social Needs  Occupational History  . Chemist  Tobacco Use  . Smoking status: Current Every Day Smoker    Packs/day: 0.50    Types: Cigarettes  . Smokeless tobacco: Former Engineer, waterUser  Substance and Sexual Activity  . Alcohol use: Yes    Alcohol/week: 5.0 oz    Types: 10 drink(s) per week    Comment: weekends  . Drug use: No  . Sexual activity: Not on file    ROS Constitutional: Denies fever, chills, weight loss/gain, headaches,  insomnia,  night sweats or change in appetite. Does c/o fatigue. Eyes: Denies redness, blurred vision, diplopia, discharge, itchy or watery eyes.  ENT: Denies discharge, congestion, post nasal drip, epistaxis, sore throat, earache, hearing loss, dental pain, Tinnitus, Vertigo, Sinus pain or snoring.  Cardio: Denies chest pain, palpitations, irregular heartbeat, syncope, dyspnea, diaphoresis, orthopnea, PND, claudication or edema Respiratory: denies cough, dyspnea, DOE, pleurisy, hoarseness, laryngitis or wheezing.  Gastrointestinal: Denies dysphagia, heartburn, reflux, water brash, pain, cramps, nausea, vomiting, bloating, diarrhea, constipation, hematemesis, melena, hematochezia, jaundice or hemorrhoids Genitourinary: Denies dysuria, frequency, urgency, nocturia, hesitancy, discharge, hematuria or flank pain Musculoskeletal: Denies arthralgia, myalgia, stiffness, Jt. Swelling, pain, limp or strain/sprain. Denies Falls. Skin: Denies puritis, rash, hives, warts, acne, eczema or change in skin lesion Neuro: No weakness, tremor, incoordination, spasms, paresthesia or pain Psychiatric: Denies confusion, memory loss or sensory loss. Denies Depression. Endocrine: Denies change in weight, skin, hair change, nocturia, and paresthesia, diabetic polys, visual blurring or hyper / hypo glycemic episodes.  Heme/Lymph: No excessive bleeding, bruising or enlarged lymph nodes.  Physical Exam  BP 106/82   Pulse 72   Temp 97.7 F (36.5 C)   Resp 18   Ht 5' 8.5" (1.74 m)   Wt 154 lb 9.6 oz (70.1 kg)   BMI 23.16 kg/m   General Appearance: Well nourished and well groomed and in no apparent distress.  Eyes: PERRLA, EOMs, conjunctiva no swelling or erythema, normal fundi and vessels. Sinuses: No frontal/maxillary tenderness ENT/Mouth: EACs patent / TMs  nl. Nares clear without erythema, swelling, mucoid exudates. Oral hygiene is good. No erythema, swelling, or exudate. Tongue normal, non-obstructing. Tonsils  not swollen or erythematous. Hearing normal.  Neck: Supple, thyroid normal. No bruits, nodes or JVD. Respiratory: Respiratory effort normal.  BS equal and clear bilateral without rales, rhonci, wheezing or stridor. Cardio: Heart sounds are normal with regular rate and rhythm and no murmurs, rubs or gallops. Peripheral pulses are normal and equal bilaterally without edema. No aortic or femoral bruits. Chest: symmetric with normal excursions and percussion.  Abdomen: Soft, with Nl bowel sounds. Nontender, no guarding, rebound, hernias, masses, or organomegaly.  Lymphatics: Non tender without lymphadenopathy.  Genitourinary: No hernias.Testes nl. DRE - prostate nl for age - smooth & firm w/o nodules. Musculoskeletal: Full ROM all peripheral extremities, joint stability, 5/5 strength, and normal gait. Skin: Warm and dry without rashes, lesions, cyanosis, clubbing or  ecchymosis.  Neuro: Cranial nerves intact, reflexes equal bilaterally. Normal muscle tone, no cerebellar symptoms. Sensation intact.  Pysch: Alert and oriented X 3 with normal affect, insight and judgment appropriate.   Assessment and Plan  1. Annual Preventative/Screening Exam   2. Labile hypertension  - DG Chest 2 View - EKG  12-Lead - [place], RETROPERITNL ABD,  LTD - Urinalysis, Routine w reflex microscopic - Microalbumin / creatinine urine ratio - CBC with Differential/Platelet - BASIC METABOLIC PANEL WITH GFR - Magnesium - TSH  3. Hyperlipidemia, mixed  - EKG 12-Lead - [place], RETROPERITNL ABD,  LTD - Hepatic function panel - Lipid panel - TSH  4. Prediabetes  - EKG 12-Lead - [place], RETROPERITNL ABD,  LTD - Hemoglobin A1c - Insulin, random  5. Vitamin D deficiency  - VITAMIN D 25 Hydroxy  6. Testosterone deficiency  - Testosterone  7. Screening for colorectal cancer  - POC Hemoccult Bld/Stl   8. Prostate cancer screening  - PSA  9. Screening for ischemic heart disease  - EKG 12-Lead  10. Screening for  AAA (aortic abdominal aneurysm)  - [place], RETROPERITNL ABD,  LTD  11. Smoker  - [place], RETROPERITNL ABD,  LTD  12. Screening examination for pulmonary tuberculosis  - PPD  13. Fatigue, unspecified type  - Iron,Total/Total Iron Binding Cap - Vitamin B12 - Testosterone - TSH  14. Medication management  - Urinalysis, Routine w reflex microscopic - Microalbumin / creatinine urine ratio - CBC with Differential/Platelet - BASIC METABOLIC PANEL WITH GFR - Hepatic function panel - Magnesium - Lipid panel - TSH - Hemoglobin A1c - Insulin, random - VITAMIN D 25 Hydroxy         Patient was counseled in prudent diet, weight control to achieve/maintain BMI less than 25, BP monitoring, regular exercise and medications as discussed.  Discussed med effects and SE's. Routine screening labs and tests as requested with regular follow-up as recommended. Over 40 minutes of exam, counseling, chart review and high complex critical decision making was performed

## 2016-12-24 ENCOUNTER — Ambulatory Visit (HOSPITAL_COMMUNITY)
Admission: RE | Admit: 2016-12-24 | Discharge: 2016-12-24 | Disposition: A | Discharge status: Home / Self Care | Payer: BLUE CROSS/BLUE SHIELD | Source: Ambulatory Visit | Attending: Internal Medicine | Admitting: Internal Medicine

## 2016-12-24 DIAGNOSIS — R918 Other nonspecific abnormal finding of lung field: Secondary | ICD-10-CM | POA: Insufficient documentation

## 2016-12-24 DIAGNOSIS — R0989 Other specified symptoms and signs involving the circulatory and respiratory systems: Secondary | ICD-10-CM | POA: Diagnosis present

## 2016-12-24 LAB — BASIC METABOLIC PANEL WITH GFR
BUN: 19 mg/dL (ref 7–25)
CALCIUM: 9.1 mg/dL (ref 8.6–10.3)
CO2: 25 mmol/L (ref 20–32)
Chloride: 106 mmol/L (ref 98–110)
Creat: 1.16 mg/dL (ref 0.70–1.33)
GFR, EST AFRICAN AMERICAN: 81 mL/min/{1.73_m2} (ref >=60)
GFR, EST NON AFRICAN AMERICAN: 70 mL/min/{1.73_m2} (ref >=60)
Glucose, Bld: 93 mg/dL (ref 65–99)
Potassium: 4.3 mmol/L (ref 3.5–5.3)
Sodium: 139 mmol/L (ref 135–146)

## 2016-12-24 LAB — IRON, TOTAL/TOTAL IRON BINDING CAP
%SAT: 36 % (calc) (ref 15–60)
IRON: 106 ug/dL (ref 50–180)
TIBC: 296 ug/dL (ref 250–425)

## 2016-12-24 LAB — URINALYSIS, ROUTINE W REFLEX MICROSCOPIC
Bilirubin Urine: NEGATIVE
Glucose, UA: NEGATIVE
Hgb urine dipstick: NEGATIVE
Ketones, ur: NEGATIVE
LEUKOCYTES UA: NEGATIVE
Nitrite: NEGATIVE
PROTEIN: NEGATIVE
SPECIFIC GRAVITY, URINE: 1.017 (ref 1.001–1.03)
pH: 5.5 (ref 5.0–8.0)

## 2016-12-24 LAB — CBC WITH DIFFERENTIAL/PLATELET
BASOS ABS: 22 {cells}/uL (ref 0–200)
Basophils Relative: 0.4 %
EOS ABS: 364 {cells}/uL (ref 15–500)
Eosinophils Relative: 6.5 %
HEMATOCRIT: 44.6 % (ref 38.5–50.0)
HEMOGLOBIN: 15.1 g/dL (ref 13.2–17.1)
LYMPHS ABS: 1574 {cells}/uL (ref 850–3900)
MCH: 30.8 pg (ref 27.0–33.0)
MCHC: 33.9 g/dL (ref 32.0–36.0)
MCV: 90.8 fL (ref 80.0–100.0)
MONOS PCT: 8.8 %
MPV: 11 fL (ref 7.5–12.5)
NEUTROS ABS: 3147 {cells}/uL (ref 1500–7800)
NEUTROS PCT: 56.2 %
Platelets: 248 10*3/uL (ref 140–400)
RBC: 4.91 10*6/uL (ref 4.20–5.80)
RDW: 12.7 % (ref 11.0–15.0)
Total Lymphocyte: 28.1 %
WBC mixed population: 493 cells/uL (ref 200–950)
WBC: 5.6 10*3/uL (ref 3.8–10.8)

## 2016-12-24 LAB — HEPATIC FUNCTION PANEL
AG RATIO: 2 (calc) (ref 1.0–2.5)
ALBUMIN MSPROF: 4.2 g/dL (ref 3.6–5.1)
ALT: 18 U/L (ref 9–46)
AST: 21 U/L (ref 10–35)
Alkaline phosphatase (APISO): 40 U/L (ref 40–115)
BILIRUBIN DIRECT: 0.1 mg/dL (ref 0.0–0.2)
BILIRUBIN TOTAL: 0.3 mg/dL (ref 0.2–1.2)
GLOBULIN: 2.1 g/dL (ref 1.9–3.7)
Indirect Bilirubin: 0.2 mg/dL (calc) (ref 0.2–1.2)
Total Protein: 6.3 g/dL (ref 6.1–8.1)

## 2016-12-24 LAB — TESTOSTERONE: Testosterone: 502 ng/dL (ref 250–827)

## 2016-12-24 LAB — LIPID PANEL
CHOL/HDL RATIO: 2.6 (calc) (ref <5.0)
Cholesterol: 184 mg/dL (ref <200)
HDL: 72 mg/dL (ref >40)
LDL CHOLESTEROL (CALC): 92 mg/dL
Non-HDL Cholesterol (Calc): 112 mg/dL (calc) (ref <130)
TRIGLYCERIDES: 106 mg/dL (ref <150)

## 2016-12-24 LAB — MICROALBUMIN / CREATININE URINE RATIO
CREATININE, URINE: 86 mg/dL (ref 20–320)
Microalb, Ur: <0.2 mg/dL

## 2016-12-24 LAB — HEMOGLOBIN A1C
HEMOGLOBIN A1C: 5.6 %{Hb} (ref <5.7)
Mean Plasma Glucose: 114 (calc)
eAG (mmol/L): 6.3 (calc)

## 2016-12-24 LAB — TSH: TSH: 1.08 mIU/L (ref 0.40–4.50)

## 2016-12-24 LAB — VITAMIN D 25 HYDROXY (VIT D DEFICIENCY, FRACTURES): VIT D 25 HYDROXY: 58 ng/mL (ref 30–100)

## 2016-12-24 LAB — INSULIN, RANDOM: Insulin: 3.6 u[IU]/mL (ref 2.0–19.6)

## 2016-12-24 LAB — VITAMIN B12: Vitamin B-12: 293 pg/mL (ref 200–1100)

## 2016-12-24 LAB — PSA: PSA: 0.6 ng/mL (ref <=4.0)

## 2016-12-24 LAB — MAGNESIUM: Magnesium: 2.2 mg/dL (ref 1.5–2.5)

## 2016-12-29 ENCOUNTER — Encounter (INDEPENDENT_AMBULATORY_CARE_PROVIDER_SITE_OTHER): Payer: Self-pay

## 2017-01-05 LAB — TB SKIN TEST
Induration: 0 mm
TB Skin Test: NEGATIVE

## 2017-02-13 ENCOUNTER — Other Ambulatory Visit: Payer: Self-pay | Admitting: Internal Medicine

## 2017-03-11 DIAGNOSIS — M542 Cervicalgia: Secondary | ICD-10-CM | POA: Diagnosis not present

## 2017-03-11 DIAGNOSIS — M5013 Cervical disc disorder with radiculopathy, cervicothoracic region: Secondary | ICD-10-CM | POA: Diagnosis not present

## 2017-03-11 DIAGNOSIS — M9903 Segmental and somatic dysfunction of lumbar region: Secondary | ICD-10-CM | POA: Diagnosis not present

## 2017-03-11 DIAGNOSIS — M9901 Segmental and somatic dysfunction of cervical region: Secondary | ICD-10-CM | POA: Diagnosis not present

## 2017-03-12 DIAGNOSIS — M9901 Segmental and somatic dysfunction of cervical region: Secondary | ICD-10-CM | POA: Diagnosis not present

## 2017-03-12 DIAGNOSIS — M5013 Cervical disc disorder with radiculopathy, cervicothoracic region: Secondary | ICD-10-CM | POA: Diagnosis not present

## 2017-03-12 DIAGNOSIS — M542 Cervicalgia: Secondary | ICD-10-CM | POA: Diagnosis not present

## 2017-03-12 DIAGNOSIS — M5031 Other cervical disc degeneration,  high cervical region: Secondary | ICD-10-CM | POA: Diagnosis not present

## 2017-03-12 DIAGNOSIS — M9903 Segmental and somatic dysfunction of lumbar region: Secondary | ICD-10-CM | POA: Diagnosis not present

## 2017-03-12 DIAGNOSIS — M419 Scoliosis, unspecified: Secondary | ICD-10-CM | POA: Diagnosis not present

## 2017-03-16 DIAGNOSIS — M9903 Segmental and somatic dysfunction of lumbar region: Secondary | ICD-10-CM | POA: Diagnosis not present

## 2017-03-16 DIAGNOSIS — M5013 Cervical disc disorder with radiculopathy, cervicothoracic region: Secondary | ICD-10-CM | POA: Diagnosis not present

## 2017-03-16 DIAGNOSIS — M542 Cervicalgia: Secondary | ICD-10-CM | POA: Diagnosis not present

## 2017-03-16 DIAGNOSIS — M9901 Segmental and somatic dysfunction of cervical region: Secondary | ICD-10-CM | POA: Diagnosis not present

## 2017-03-18 DIAGNOSIS — M9901 Segmental and somatic dysfunction of cervical region: Secondary | ICD-10-CM | POA: Diagnosis not present

## 2017-03-18 DIAGNOSIS — M542 Cervicalgia: Secondary | ICD-10-CM | POA: Diagnosis not present

## 2017-03-18 DIAGNOSIS — M5013 Cervical disc disorder with radiculopathy, cervicothoracic region: Secondary | ICD-10-CM | POA: Diagnosis not present

## 2017-03-18 DIAGNOSIS — M9903 Segmental and somatic dysfunction of lumbar region: Secondary | ICD-10-CM | POA: Diagnosis not present

## 2017-03-19 DIAGNOSIS — M9901 Segmental and somatic dysfunction of cervical region: Secondary | ICD-10-CM | POA: Diagnosis not present

## 2017-03-19 DIAGNOSIS — M9903 Segmental and somatic dysfunction of lumbar region: Secondary | ICD-10-CM | POA: Diagnosis not present

## 2017-03-19 DIAGNOSIS — M542 Cervicalgia: Secondary | ICD-10-CM | POA: Diagnosis not present

## 2017-03-19 DIAGNOSIS — M5013 Cervical disc disorder with radiculopathy, cervicothoracic region: Secondary | ICD-10-CM | POA: Diagnosis not present

## 2017-03-22 ENCOUNTER — Other Ambulatory Visit: Payer: Self-pay | Admitting: Internal Medicine

## 2017-03-22 DIAGNOSIS — J4520 Mild intermittent asthma, uncomplicated: Secondary | ICD-10-CM

## 2017-03-23 DIAGNOSIS — M9901 Segmental and somatic dysfunction of cervical region: Secondary | ICD-10-CM | POA: Diagnosis not present

## 2017-03-23 DIAGNOSIS — M9903 Segmental and somatic dysfunction of lumbar region: Secondary | ICD-10-CM | POA: Diagnosis not present

## 2017-03-23 DIAGNOSIS — M5013 Cervical disc disorder with radiculopathy, cervicothoracic region: Secondary | ICD-10-CM | POA: Diagnosis not present

## 2017-03-23 DIAGNOSIS — M542 Cervicalgia: Secondary | ICD-10-CM | POA: Diagnosis not present

## 2017-03-25 DIAGNOSIS — M5013 Cervical disc disorder with radiculopathy, cervicothoracic region: Secondary | ICD-10-CM | POA: Diagnosis not present

## 2017-03-25 DIAGNOSIS — M542 Cervicalgia: Secondary | ICD-10-CM | POA: Diagnosis not present

## 2017-03-25 DIAGNOSIS — M9901 Segmental and somatic dysfunction of cervical region: Secondary | ICD-10-CM | POA: Diagnosis not present

## 2017-03-25 DIAGNOSIS — M9903 Segmental and somatic dysfunction of lumbar region: Secondary | ICD-10-CM | POA: Diagnosis not present

## 2017-03-26 DIAGNOSIS — M9901 Segmental and somatic dysfunction of cervical region: Secondary | ICD-10-CM | POA: Diagnosis not present

## 2017-03-26 DIAGNOSIS — M9903 Segmental and somatic dysfunction of lumbar region: Secondary | ICD-10-CM | POA: Diagnosis not present

## 2017-03-26 DIAGNOSIS — M5013 Cervical disc disorder with radiculopathy, cervicothoracic region: Secondary | ICD-10-CM | POA: Diagnosis not present

## 2017-03-26 DIAGNOSIS — M542 Cervicalgia: Secondary | ICD-10-CM | POA: Diagnosis not present

## 2017-03-30 DIAGNOSIS — M9903 Segmental and somatic dysfunction of lumbar region: Secondary | ICD-10-CM | POA: Diagnosis not present

## 2017-03-30 DIAGNOSIS — M5013 Cervical disc disorder with radiculopathy, cervicothoracic region: Secondary | ICD-10-CM | POA: Diagnosis not present

## 2017-03-30 DIAGNOSIS — M9901 Segmental and somatic dysfunction of cervical region: Secondary | ICD-10-CM | POA: Diagnosis not present

## 2017-03-30 DIAGNOSIS — M542 Cervicalgia: Secondary | ICD-10-CM | POA: Diagnosis not present

## 2017-04-02 DIAGNOSIS — M9901 Segmental and somatic dysfunction of cervical region: Secondary | ICD-10-CM | POA: Diagnosis not present

## 2017-04-02 DIAGNOSIS — M542 Cervicalgia: Secondary | ICD-10-CM | POA: Diagnosis not present

## 2017-04-02 DIAGNOSIS — M9903 Segmental and somatic dysfunction of lumbar region: Secondary | ICD-10-CM | POA: Diagnosis not present

## 2017-04-02 DIAGNOSIS — M5013 Cervical disc disorder with radiculopathy, cervicothoracic region: Secondary | ICD-10-CM | POA: Diagnosis not present

## 2017-04-06 DIAGNOSIS — M542 Cervicalgia: Secondary | ICD-10-CM | POA: Diagnosis not present

## 2017-04-06 DIAGNOSIS — M9903 Segmental and somatic dysfunction of lumbar region: Secondary | ICD-10-CM | POA: Diagnosis not present

## 2017-04-06 DIAGNOSIS — M5013 Cervical disc disorder with radiculopathy, cervicothoracic region: Secondary | ICD-10-CM | POA: Diagnosis not present

## 2017-04-06 DIAGNOSIS — M9901 Segmental and somatic dysfunction of cervical region: Secondary | ICD-10-CM | POA: Diagnosis not present

## 2017-04-09 DIAGNOSIS — M542 Cervicalgia: Secondary | ICD-10-CM | POA: Diagnosis not present

## 2017-04-09 DIAGNOSIS — M9903 Segmental and somatic dysfunction of lumbar region: Secondary | ICD-10-CM | POA: Diagnosis not present

## 2017-04-09 DIAGNOSIS — M9901 Segmental and somatic dysfunction of cervical region: Secondary | ICD-10-CM | POA: Diagnosis not present

## 2017-04-09 DIAGNOSIS — M5013 Cervical disc disorder with radiculopathy, cervicothoracic region: Secondary | ICD-10-CM | POA: Diagnosis not present

## 2017-04-13 DIAGNOSIS — M5013 Cervical disc disorder with radiculopathy, cervicothoracic region: Secondary | ICD-10-CM | POA: Diagnosis not present

## 2017-04-13 DIAGNOSIS — M542 Cervicalgia: Secondary | ICD-10-CM | POA: Diagnosis not present

## 2017-04-13 DIAGNOSIS — M9901 Segmental and somatic dysfunction of cervical region: Secondary | ICD-10-CM | POA: Diagnosis not present

## 2017-04-13 DIAGNOSIS — M9903 Segmental and somatic dysfunction of lumbar region: Secondary | ICD-10-CM | POA: Diagnosis not present

## 2017-04-16 DIAGNOSIS — M9901 Segmental and somatic dysfunction of cervical region: Secondary | ICD-10-CM | POA: Diagnosis not present

## 2017-04-16 DIAGNOSIS — M9903 Segmental and somatic dysfunction of lumbar region: Secondary | ICD-10-CM | POA: Diagnosis not present

## 2017-04-16 DIAGNOSIS — M5013 Cervical disc disorder with radiculopathy, cervicothoracic region: Secondary | ICD-10-CM | POA: Diagnosis not present

## 2017-04-16 DIAGNOSIS — M542 Cervicalgia: Secondary | ICD-10-CM | POA: Diagnosis not present

## 2017-04-27 DIAGNOSIS — M542 Cervicalgia: Secondary | ICD-10-CM | POA: Diagnosis not present

## 2017-04-27 DIAGNOSIS — M5013 Cervical disc disorder with radiculopathy, cervicothoracic region: Secondary | ICD-10-CM | POA: Diagnosis not present

## 2017-04-27 DIAGNOSIS — M9901 Segmental and somatic dysfunction of cervical region: Secondary | ICD-10-CM | POA: Diagnosis not present

## 2017-04-27 DIAGNOSIS — M9903 Segmental and somatic dysfunction of lumbar region: Secondary | ICD-10-CM | POA: Diagnosis not present

## 2017-05-11 DIAGNOSIS — M5013 Cervical disc disorder with radiculopathy, cervicothoracic region: Secondary | ICD-10-CM | POA: Diagnosis not present

## 2017-05-11 DIAGNOSIS — M9903 Segmental and somatic dysfunction of lumbar region: Secondary | ICD-10-CM | POA: Diagnosis not present

## 2017-05-11 DIAGNOSIS — M9901 Segmental and somatic dysfunction of cervical region: Secondary | ICD-10-CM | POA: Diagnosis not present

## 2017-05-11 DIAGNOSIS — M542 Cervicalgia: Secondary | ICD-10-CM | POA: Diagnosis not present

## 2017-08-03 IMAGING — CR DG CHEST 2V
3 series · 3 of 3 positions shown · non-contrast
Comparison: Chest radiograph 11/16/2014

CLINICAL DATA: Smoking history.  Hypertension.

EXAM:
CHEST  2 VIEW

[chest pa]
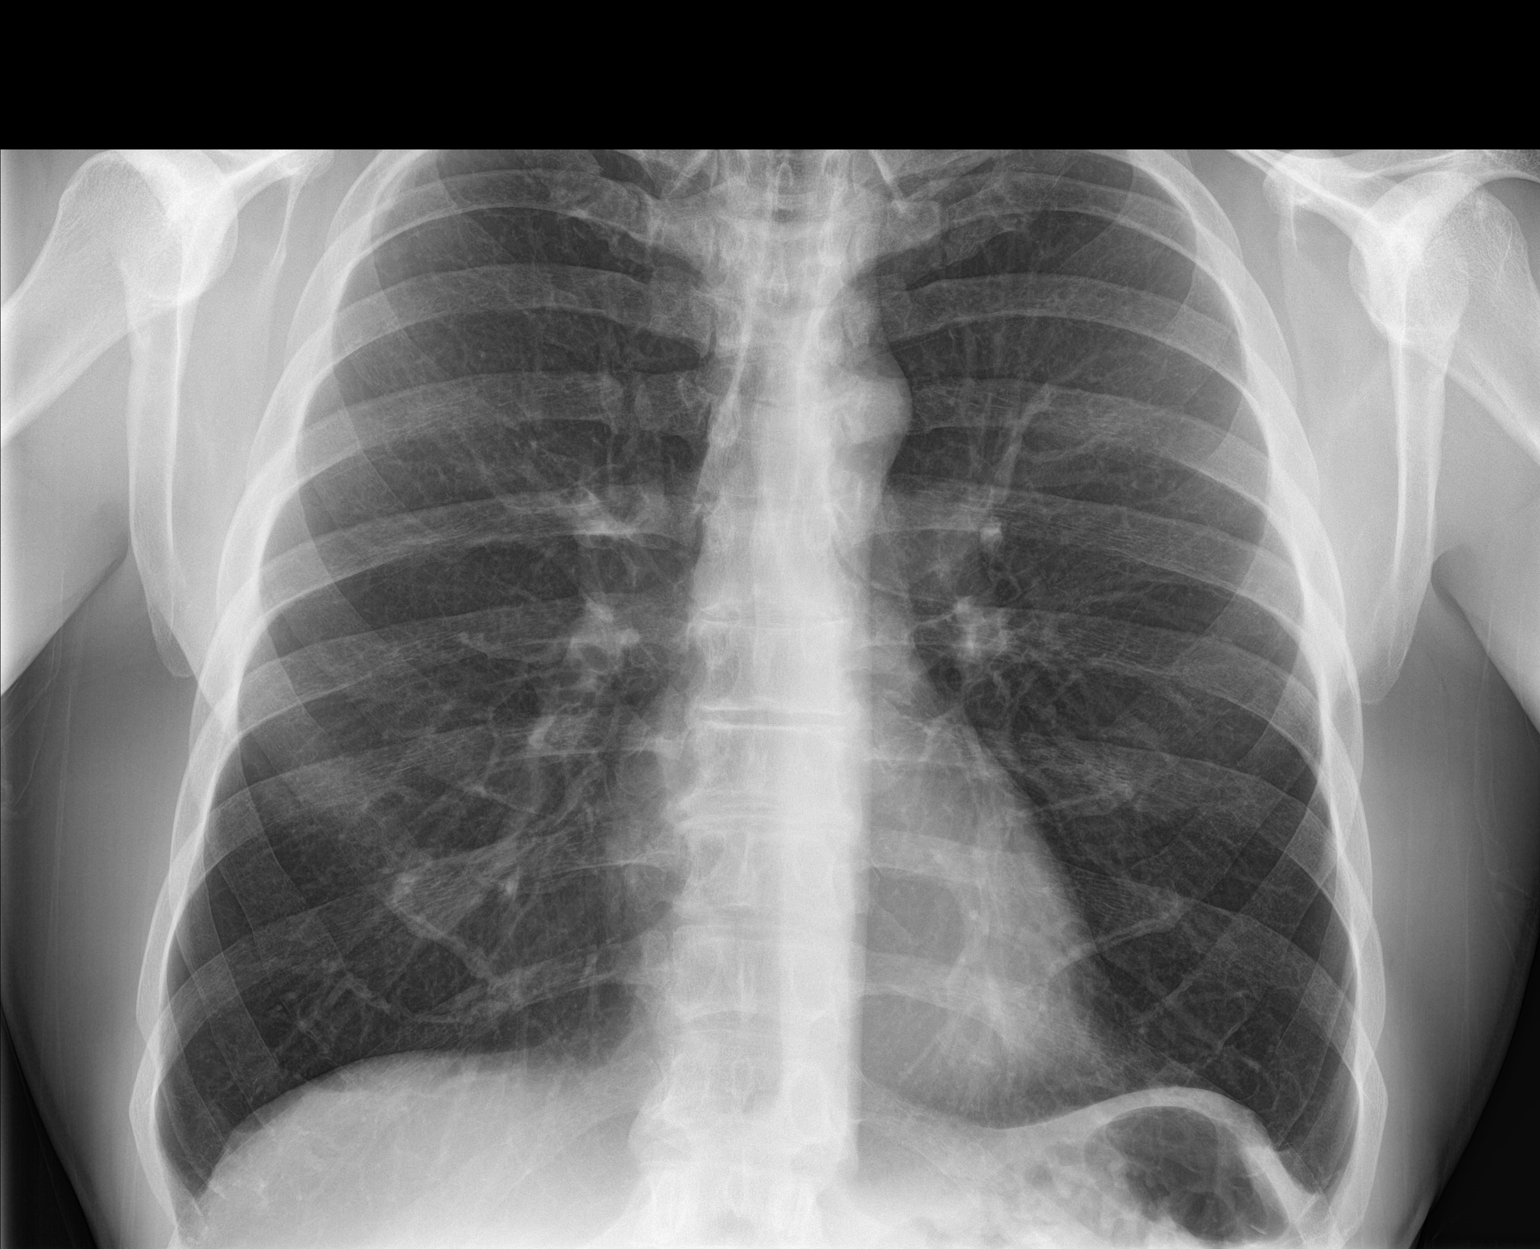

[chest lat (1 of 2)]
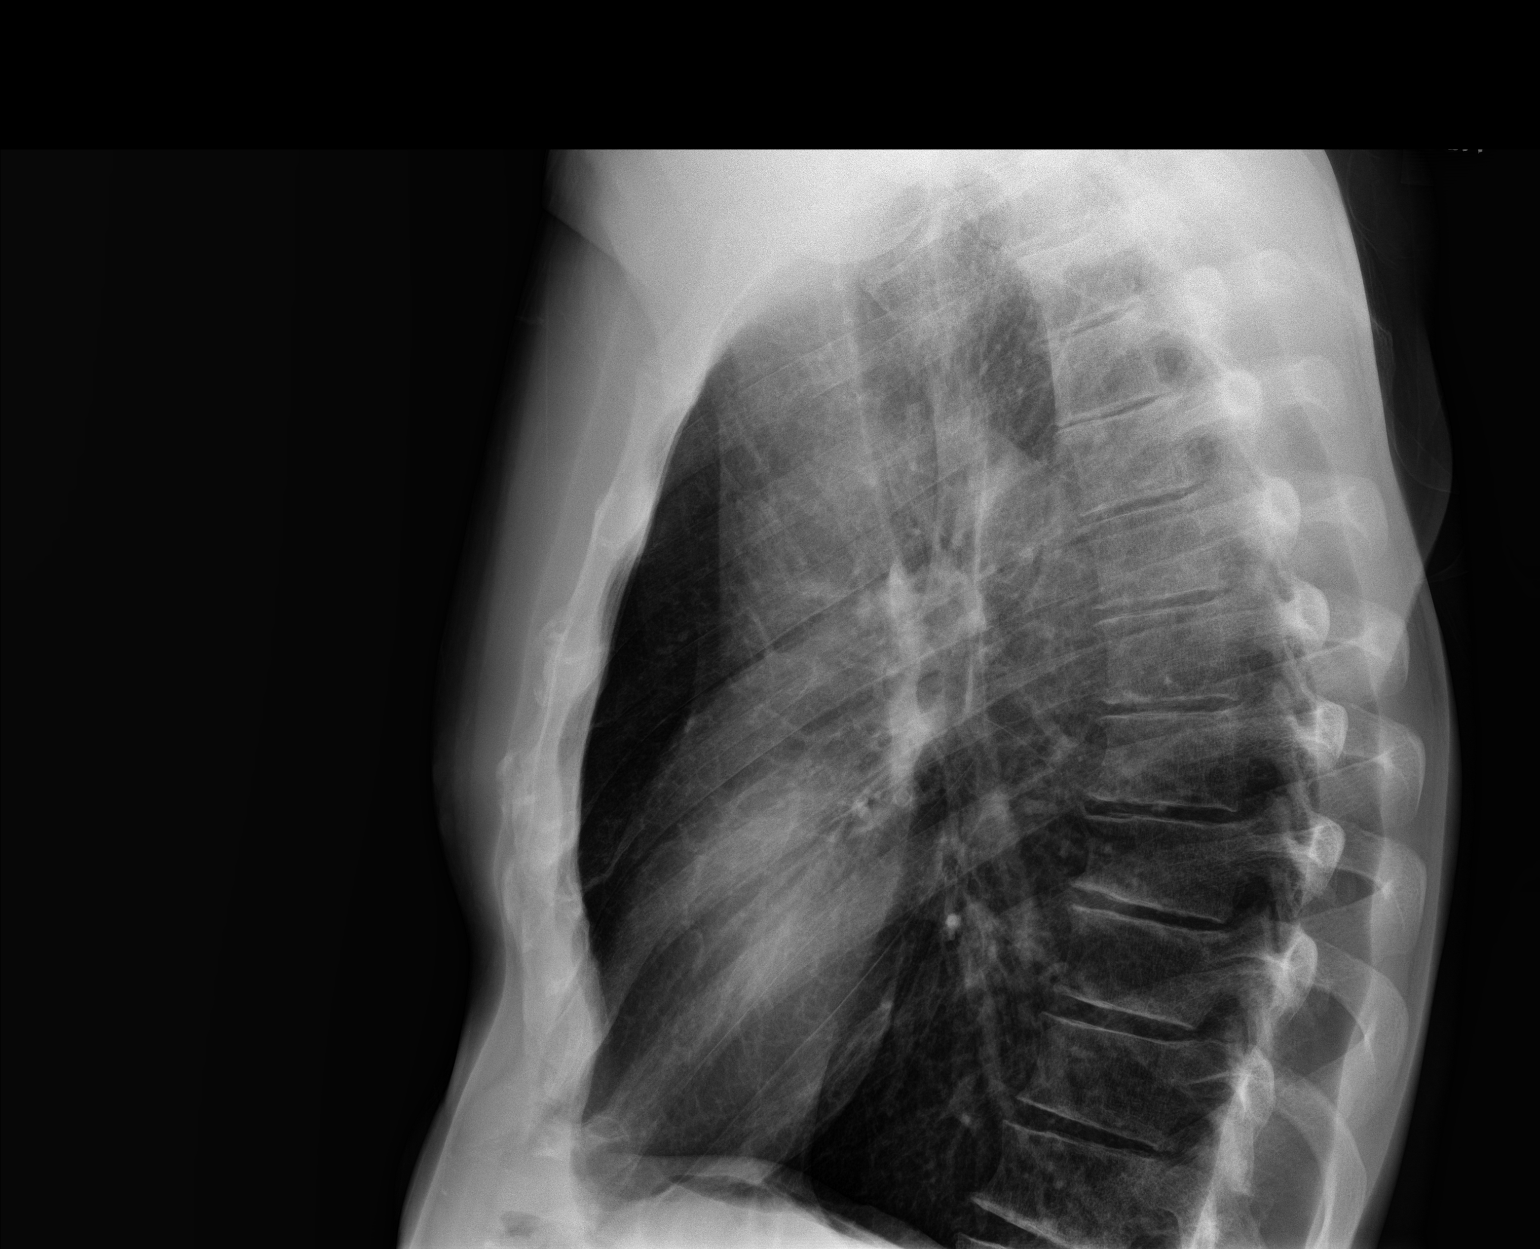

[chest lat (2 of 2)]
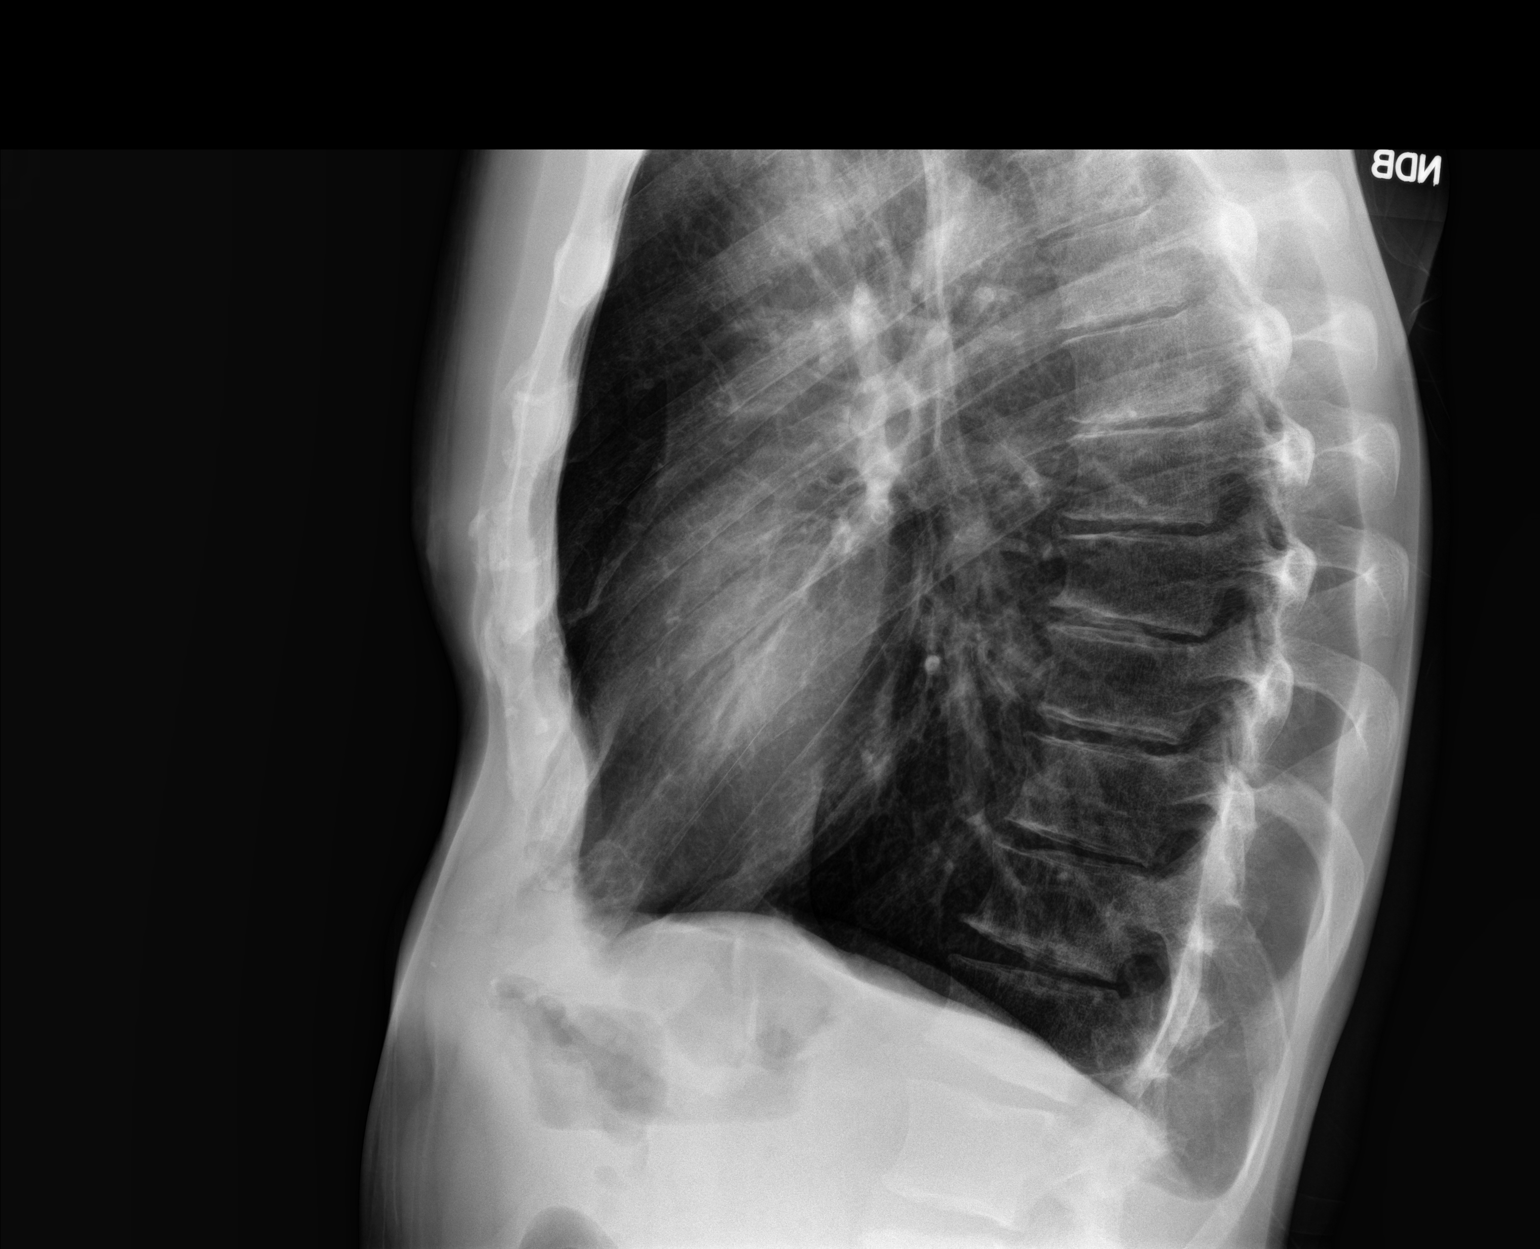

[3 of 3 positions shown; findings below may reference images not displayed]

FINDINGS: The lungs are hyperexpanded. No pulmonary edema or focal airspace
consolidation. Cardiomediastinal contours are normal. No
pneumothorax or sizable pleural effusion.
IMPRESSION: Hyperexpanded lungs, suggesting COPD.  No focal airspace disease.

## 2017-08-08 ENCOUNTER — Other Ambulatory Visit: Payer: Self-pay | Admitting: Internal Medicine

## 2017-12-30 DIAGNOSIS — Z23 Encounter for immunization: Secondary | ICD-10-CM | POA: Diagnosis not present

## 2018-02-03 ENCOUNTER — Encounter: Payer: Self-pay | Admitting: Internal Medicine

## 2018-02-03 ENCOUNTER — Ambulatory Visit (INDEPENDENT_AMBULATORY_CARE_PROVIDER_SITE_OTHER): Payer: BLUE CROSS/BLUE SHIELD | Admitting: Internal Medicine

## 2018-02-03 VITALS — BP 104/76 | HR 76 | Temp 97.5°F | Resp 16 | Ht 68.5 in | Wt 150.2 lb

## 2018-02-03 DIAGNOSIS — Z125 Encounter for screening for malignant neoplasm of prostate: Secondary | ICD-10-CM

## 2018-02-03 DIAGNOSIS — Z79899 Other long term (current) drug therapy: Secondary | ICD-10-CM

## 2018-02-03 DIAGNOSIS — I10 Essential (primary) hypertension: Secondary | ICD-10-CM | POA: Diagnosis not present

## 2018-02-03 DIAGNOSIS — Z0000 Encounter for general adult medical examination without abnormal findings: Secondary | ICD-10-CM

## 2018-02-03 DIAGNOSIS — E559 Vitamin D deficiency, unspecified: Secondary | ICD-10-CM | POA: Diagnosis not present

## 2018-02-03 DIAGNOSIS — Z13220 Encounter for screening for lipoid disorders: Secondary | ICD-10-CM

## 2018-02-03 DIAGNOSIS — R5383 Other fatigue: Secondary | ICD-10-CM

## 2018-02-03 DIAGNOSIS — Z111 Encounter for screening for respiratory tuberculosis: Secondary | ICD-10-CM

## 2018-02-03 DIAGNOSIS — Z122 Encounter for screening for malignant neoplasm of respiratory organs: Secondary | ICD-10-CM

## 2018-02-03 DIAGNOSIS — Z1211 Encounter for screening for malignant neoplasm of colon: Secondary | ICD-10-CM

## 2018-02-03 DIAGNOSIS — Z131 Encounter for screening for diabetes mellitus: Secondary | ICD-10-CM | POA: Diagnosis not present

## 2018-02-03 DIAGNOSIS — Z8249 Family history of ischemic heart disease and other diseases of the circulatory system: Secondary | ICD-10-CM

## 2018-02-03 DIAGNOSIS — Z1329 Encounter for screening for other suspected endocrine disorder: Secondary | ICD-10-CM | POA: Diagnosis not present

## 2018-02-03 DIAGNOSIS — Z130 Encounter for screening for diseases of the blood and blood-forming organs and certain disorders involving the immune mechanism: Secondary | ICD-10-CM | POA: Diagnosis not present

## 2018-02-03 DIAGNOSIS — Z1389 Encounter for screening for other disorder: Secondary | ICD-10-CM

## 2018-02-03 DIAGNOSIS — N401 Enlarged prostate with lower urinary tract symptoms: Secondary | ICD-10-CM | POA: Diagnosis not present

## 2018-02-03 DIAGNOSIS — F17200 Nicotine dependence, unspecified, uncomplicated: Secondary | ICD-10-CM

## 2018-02-03 DIAGNOSIS — R350 Frequency of micturition: Secondary | ICD-10-CM | POA: Diagnosis not present

## 2018-02-03 DIAGNOSIS — R7303 Prediabetes: Secondary | ICD-10-CM

## 2018-02-03 DIAGNOSIS — R0989 Other specified symptoms and signs involving the circulatory and respiratory systems: Secondary | ICD-10-CM

## 2018-02-03 DIAGNOSIS — Z1212 Encounter for screening for malignant neoplasm of rectum: Secondary | ICD-10-CM

## 2018-02-03 DIAGNOSIS — E349 Endocrine disorder, unspecified: Secondary | ICD-10-CM

## 2018-02-03 DIAGNOSIS — Z136 Encounter for screening for cardiovascular disorders: Secondary | ICD-10-CM

## 2018-02-03 DIAGNOSIS — Z0001 Encounter for general adult medical examination with abnormal findings: Secondary | ICD-10-CM

## 2018-02-03 DIAGNOSIS — E782 Mixed hyperlipidemia: Secondary | ICD-10-CM

## 2018-02-03 NOTE — Progress Notes (Addendum)
[facility]   Lucky Cowboy, M.D.     [name]. [name], P.A.-C Judd Gaudier, DNP [facility]                [address]                [place], [place]. [address] Telephone [phone] Telefax [phone] Annual  Screening/Preventative Visit  & Comprehensive Evaluation & Examination     This very nice 57 y.o. MWM presents for a Screening /Preventative Visit & comprehensive evaluation and management of multiple medical co-morbidities.  Patient has been followed for labile HTN, HLD, Prediabetes and Vitamin D Deficiency.  Patient also has hx/o seasonal Allergic Asthma which has been controlled on his inhalers.     Due to his  long history of smoking over 40 pk years,  I discussed lung cancer screening with him . He was agreeable to undergo a screening low dose CT scan of the chest.  We discussed smoking cessation techniques/options. I will refer him  for a LDCT lung scan & lung cancer screening program     Patient is followed for Labile HTN  since 2000. Patient's BP has been controlled and today's BP is at goal - 104/76. Patient denies any cardiac symptoms as chest pain, palpitations, shortness of breath, dizziness or ankle swelling.     Patient's hyperlipidemia is controlled with diet. Last lipids were at goal: Lab Results  Component Value Date   CHOL 184 12/23/2016   HDL 72 12/23/2016   LDLCALC 92 12/23/2016   TRIG 106 12/23/2016   CHOLHDL 2.6 12/23/2016      Patient has hx/o prediabetes  A1c 5.7% / 2012)  and patient denies reactive hypoglycemic symptoms, visual blurring, diabetic polys or paresthesias. Last A1c was Normal & at goal: Lab Results  Component Value Date   HGBA1C 5.6 12/23/2016       Finally, patient has history of Vitamin D Deficiency ("37" / 2008 & "27" / 2012))  and last vitamin D was at goal: Lab Results  Component Value Date   VD25OH 58 12/23/2016   Current Outpatient Medications on File  Prior to Visit  Medication Sig  . albuterol (VENTOLIN HFA) 108 (90 Base) MCG/ACT inhaler INHALE 1 OR 2 PUFFS INTO THE LUNGS (5 MINUTES APART) EVERY 4 HOURS AS NEEDED FOR RESCUE ASTHMA  . BREO ELLIPTA 100-25 MCG/INH AEPB INHALE ONE DOSE BY MOUTH DAILY  . Cholecalciferol (VITAMIN D PO) Take 4,000 Units by mouth daily.  . Multiple Vitamin (MULTIVITAMIN) tablet Take 1 tablet by mouth daily.  [name] OVER THE COUNTER MEDICATION Protein powder  . predniSONE (DELTASONE) 20 MG tablet TAKE 1 TABLET BY MOUTH THREE TIMES A DAY FOR 3 DAYS THEN TAKE 1 TABLET TWICE DAILY FOR 3 DAYS THEN TAKE 1 TABLET DAILY FOR 5 DAYS  . vitamin C (ASCORBIC ACID) 500 MG tablet Take 500 mg by mouth daily.   No current facility-administered medications on file prior to visit.    Allergies  Allergen Reactions  . Trovan [Alatrofloxacin] Other (See Comments)    Light-headed feeling   Past Medical History:  Diagnosis Date  . Allergy    Health Maintenance  Topic Date Due  . TETANUS/TDAP  10/11/1978  . INFLUENZA VACCINE  09/17/2017  . COLONOSCOPY  11/08/2023  . Hepatitis C Screening  Completed  . HIV Screening  Completed   Immunization History  Administered Date(s) Administered  . DTaP 03/21/2008  . PPD Test 09/27/2013,  11/06/2015, 12/23/2016  . Pneumococcal-Unspecified 12/18/1989  . Tdap 12/18/2017   Last Colon - 11/07/2013 - Recc 10 yr f/u due Sept 2025  Past Surgical History:  Procedure Laterality Date  . TONSILLECTOMY     age 69  . wisdom teeth     Family History  Problem Relation Age of Onset  . Heart disease Mother   . Diabetes Father   . Hypertension Father   . Colon cancer Neg Hx   . Esophageal cancer Neg Hx   . Rectal cancer Neg Hx   . Stomach cancer Neg Hx   . Pancreatic cancer Neg Hx   . Prostate cancer Neg Hx    Social History   Socioeconomic History  . Marital status: Married    Spouse name: Estil Daft   . Number of children: N2 daughters  Occupational History  . Chemist x 32 yrs at  [employer]  Tobacco Use  . Smoking status: Current Every Day Smoker    Packs/day: 0.50    Types: Cigarettes  . Smokeless tobacco: Former Engineer, water and Sexual Activity  . Alcohol use: Yes    Alcohol/week: 10.0 standard drinks    Types: 10 drink(s) per week    Comment: weekends  . Drug use: No  . Sexual activity: Active    ROS Constitutional: Denies fever, chills, weight loss/gain, headaches, insomnia,  night sweats or change in appetite. Does c/o fatigue. Eyes: Denies redness, blurred vision, diplopia, discharge, itchy or watery eyes.  ENT: Denies discharge, congestion, post nasal drip, epistaxis, sore throat, earache, hearing loss, dental pain, Tinnitus, Vertigo, Sinus pain or snoring.  Cardio: Denies chest pain, palpitations, irregular heartbeat, syncope, dyspnea, diaphoresis, orthopnea, PND, claudication or edema Respiratory: denies cough, dyspnea, DOE, pleurisy, hoarseness, laryngitis or wheezing.  Gastrointestinal: Denies dysphagia, heartburn, reflux, water brash, pain, cramps, nausea, vomiting, bloating, diarrhea, constipation, hematemesis, melena, hematochezia, jaundice or hemorrhoids Genitourinary: Denies dysuria, frequency, urgency, nocturia, hesitancy, discharge, hematuria or flank pain Musculoskeletal: Denies arthralgia, myalgia, stiffness, Jt. Swelling, pain, limp or strain/sprain. Denies Falls. Skin: Denies puritis, rash, hives, warts, acne, eczema or change in skin lesion Neuro: No weakness, tremor, incoordination, spasms, paresthesia or pain Psychiatric: Denies confusion, memory loss or sensory loss. Denies Depression. Endocrine: Denies change in weight, skin, hair change, nocturia, and paresthesia, diabetic polys, visual blurring or hyper / hypo glycemic episodes.  Heme/Lymph: No excessive bleeding, bruising or enlarged lymph nodes.  Physical Exam  BP 104/76   Pulse 76   Temp (!) 97.5 F (36.4 C)   Resp 16   Ht 5' 8.5" (1.74 m)   Wt 150 lb 3.2 oz (68.1  kg)   BMI 22.51 kg/m   General Appearance: Well nourished and well groomed and in no apparent distress.  Eyes: PERRLA, EOMs, conjunctiva no swelling or erythema, normal fundi and vessels. Sinuses: No frontal/maxillary tenderness ENT/Mouth: EACs patent / TMs  nl. Nares clear without erythema, swelling, mucoid exudates. Oral hygiene is good. No erythema, swelling, or exudate. Tongue normal, non-obstructing. Tonsils not swollen or erythematous. Hearing normal.  Neck: Supple, thyroid not palpable. No bruits, nodes or JVD. Respiratory: Respiratory effort normal.  BS equal and clear bilateral without rales, rhonci, wheezing or stridor. Cardio: Heart sounds are normal with regular rate and rhythm and no murmurs, rubs or gallops. Peripheral pulses are normal and equal bilaterally without edema. No aortic or femoral bruits. Chest: symmetric with normal excursions and percussion.  Abdomen: Soft, with Nl bowel sounds. Nontender, no guarding, rebound, hernias, masses, or organomegaly.  Lymphatics: Non tender without lymphadenopathy.  Musculoskeletal: Full ROM all peripheral extremities, joint stability, 5/5 strength, and normal gait. Skin: Warm and dry without rashes, lesions, cyanosis, clubbing or  ecchymosis.  Neuro: Cranial nerves intact, reflexes equal bilaterally. Normal muscle tone, no cerebellar symptoms. Sensation intact.  Pysch: Alert and oriented X 3 with normal affect, insight and judgment appropriate.   Assessment and Plan  1. Annual Preventative/Screening Exam   2. Labile hypertension  - EKG 12-Lead - [place], RETROPERITNL ABD,  LTD - Urinalysis, Routine w reflex microscopic - Microalbumin / creatinine urine ratio - CBC with Differential/Platelet - COMPLETE METABOLIC PANEL WITH GFR - Magnesium - TSH  3. Hyperlipidemia, mixed  - EKG 12-Lead - [place], RETROPERITNL ABD,  LTD - Lipid panel - TSH  4. Prediabetes  - EKG 12-Lead - [place], RETROPERITNL ABD,  LTD - Hemoglobin A1c - Insulin,  random  5. Vitamin D deficiency  - VITAMIN D 25 Hydroxyl  6. Testosterone deficiency  - Testosterone  7. Screening for colorectal cancer  - POC Hemoccult Bld/Stl  8. Prostate cancer screening  - PSA  9. Screening for ischemic heart disease  - EKG 12-Lead - Lipid panel  10. FHx: heart disease  - EKG 12-Lead - [place], RETROPERITNL ABD,  LTD  11. Smoker  - EKG 12-Lead - US, RETROPERITNL ABD,  LTD  12. Screening for AAA (aortic abdominal aneurysm)  - [place], RETROPERITNL ABD,  LTD  13. Screening examination for pulmonary tuberculosis  - TB Skin Test  14. Fatigue  - Iron,Total/Total Iron Binding Cap - Vitamin B12 - Testosterone - CBC with Differential/Platelet - TSH  15. Medication management  - Urinalysis, Routine w reflex microscopic - Microalbumin / creatinine urine ratio - CBC with Differential/Platelet - COMPLETE METABOLIC PANEL WITH GFR - Magnesium - Lipid panel - TSH - Hemoglobin A1c - Insulin, random - VITAMIN D 25 Hydroxyl        Patient was counseled in prudent diet, weight control to achieve/maintain BMI less than 25, BP monitoring, regular exercise and medications as discussed.  Discussed med effects and SE's. Routine screening labs and tests as requested with regular follow-up as recommended. Over 40 minutes of exam, counseling, chart review and high complex critical decision making was performed

## 2018-02-03 NOTE — Patient Instructions (Signed)

## 2018-02-04 LAB — CBC WITH DIFFERENTIAL/PLATELET
Absolute Monocytes: 450 cells/uL (ref 200–950)
Basophils Absolute: 18 cells/uL (ref 0–200)
Basophils Relative: 0.3 %
Eosinophils Absolute: 78 cells/uL (ref 15–500)
Eosinophils Relative: 1.3 %
HCT: 46.5 % (ref 38.5–50.0)
Hemoglobin: 15.9 g/dL (ref 13.2–17.1)
Lymphs Abs: 1218 cells/uL (ref 850–3900)
MCH: 31.2 pg (ref 27.0–33.0)
MCHC: 34.2 g/dL (ref 32.0–36.0)
MCV: 91.4 fL (ref 80.0–100.0)
MPV: 11.1 fL (ref 7.5–12.5)
Monocytes Relative: 7.5 %
Neutro Abs: 4236 cells/uL (ref 1500–7800)
Neutrophils Relative %: 70.6 %
Platelets: 270 10*3/uL (ref 140–400)
RBC: 5.09 10*6/uL (ref 4.20–5.80)
RDW: 13 % (ref 11.0–15.0)
Total Lymphocyte: 20.3 %
WBC: 6 10*3/uL (ref 3.8–10.8)

## 2018-02-04 LAB — MICROALBUMIN / CREATININE URINE RATIO: CREATININE, URINE: 16 mg/dL — AB (ref 20–320)

## 2018-02-04 LAB — URINALYSIS, ROUTINE W REFLEX MICROSCOPIC
Bilirubin Urine: NEGATIVE
Glucose, UA: NEGATIVE
HGB URINE DIPSTICK: NEGATIVE
Ketones, ur: NEGATIVE
LEUKOCYTES UA: NEGATIVE
NITRITE: NEGATIVE
PROTEIN: NEGATIVE
Specific Gravity, Urine: 1.005 (ref 1.001–1.03)
pH: 6 (ref 5.0–8.0)

## 2018-02-04 LAB — LIPID PANEL
Cholesterol: 199 mg/dL (ref <200)
HDL: 83 mg/dL (ref >40)
LDL Cholesterol (Calc): 103 mg/dL (calc) — ABNORMAL HIGH
Non-HDL Cholesterol (Calc): 116 mg/dL (calc) (ref <130)
TRIGLYCERIDES: 52 mg/dL (ref <150)
Total CHOL/HDL Ratio: 2.4 (calc) (ref <5.0)

## 2018-02-04 LAB — TESTOSTERONE: TESTOSTERONE: 684 ng/dL (ref 250–827)

## 2018-02-04 LAB — COMPLETE METABOLIC PANEL WITH GFR
AG RATIO: 2 (calc) (ref 1.0–2.5)
ALT: 19 U/L (ref 9–46)
AST: 21 U/L (ref 10–35)
Albumin: 4.5 g/dL (ref 3.6–5.1)
Alkaline phosphatase (APISO): 42 U/L (ref 40–115)
BILIRUBIN TOTAL: 0.6 mg/dL (ref 0.2–1.2)
BUN: 13 mg/dL (ref 7–25)
CHLORIDE: 105 mmol/L (ref 98–110)
CO2: 26 mmol/L (ref 20–32)
Calcium: 9.7 mg/dL (ref 8.6–10.3)
Creat: 1.07 mg/dL (ref 0.70–1.33)
GFR, Est African American: 88 mL/min/{1.73_m2} (ref >=60)
GFR, Est Non African American: 76 mL/min/{1.73_m2} (ref >=60)
Globulin: 2.3 g/dL (calc) (ref 1.9–3.7)
Glucose, Bld: 101 mg/dL — ABNORMAL HIGH (ref 65–99)
Potassium: 4.5 mmol/L (ref 3.5–5.3)
Sodium: 139 mmol/L (ref 135–146)
Total Protein: 6.8 g/dL (ref 6.1–8.1)

## 2018-02-04 LAB — HEMOGLOBIN A1C
Hgb A1c MFr Bld: 5.8 % of total Hgb — ABNORMAL HIGH (ref <5.7)
Mean Plasma Glucose: 120 (calc)
eAG (mmol/L): 6.6 (calc)

## 2018-02-04 LAB — IRON, TOTAL/TOTAL IRON BINDING CAP
%SAT: 38 % (calc) (ref 20–48)
Iron: 125 ug/dL (ref 50–180)
TIBC: 330 mcg/dL (calc) (ref 250–425)

## 2018-02-04 LAB — TSH: TSH: 0.98 mIU/L (ref 0.40–4.50)

## 2018-02-04 LAB — PSA: PSA: 0.6 ng/mL (ref <=4.0)

## 2018-02-04 LAB — MAGNESIUM: Magnesium: 2.2 mg/dL (ref 1.5–2.5)

## 2018-02-04 LAB — VITAMIN D 25 HYDROXY (VIT D DEFICIENCY, FRACTURES): Vit D, 25-Hydroxy: 69 ng/mL (ref 30–100)

## 2018-02-04 LAB — VITAMIN B12: Vitamin B-12: 363 pg/mL (ref 200–1100)

## 2018-02-04 LAB — INSULIN, RANDOM: Insulin: 5.1 u[IU]/mL (ref 2.0–19.6)

## 2018-02-07 ENCOUNTER — Encounter: Payer: Self-pay | Admitting: Internal Medicine

## 2018-02-23 ENCOUNTER — Ambulatory Visit
Admission: RE | Admit: 2018-02-23 | Discharge: 2018-02-23 | Disposition: A | Discharge status: Home / Self Care | Payer: BLUE CROSS/BLUE SHIELD | Source: Ambulatory Visit | Attending: Internal Medicine | Admitting: Internal Medicine

## 2018-02-23 DIAGNOSIS — F17210 Nicotine dependence, cigarettes, uncomplicated: Secondary | ICD-10-CM | POA: Diagnosis not present

## 2018-02-23 DIAGNOSIS — Z122 Encounter for screening for malignant neoplasm of respiratory organs: Secondary | ICD-10-CM

## 2018-04-29 ENCOUNTER — Other Ambulatory Visit: Payer: Self-pay | Admitting: Internal Medicine

## 2018-04-29 DIAGNOSIS — J4520 Mild intermittent asthma, uncomplicated: Secondary | ICD-10-CM

## 2018-09-20 IMAGING — CR DG CHEST 2V
3 series · 3 of 3 positions shown · non-contrast
Comparison: 11/07/2015

CLINICAL DATA: Patient denies chest complaints today. Smoker.

EXAM:
CHEST  2 VIEW

[chest pa (1 of 2)]
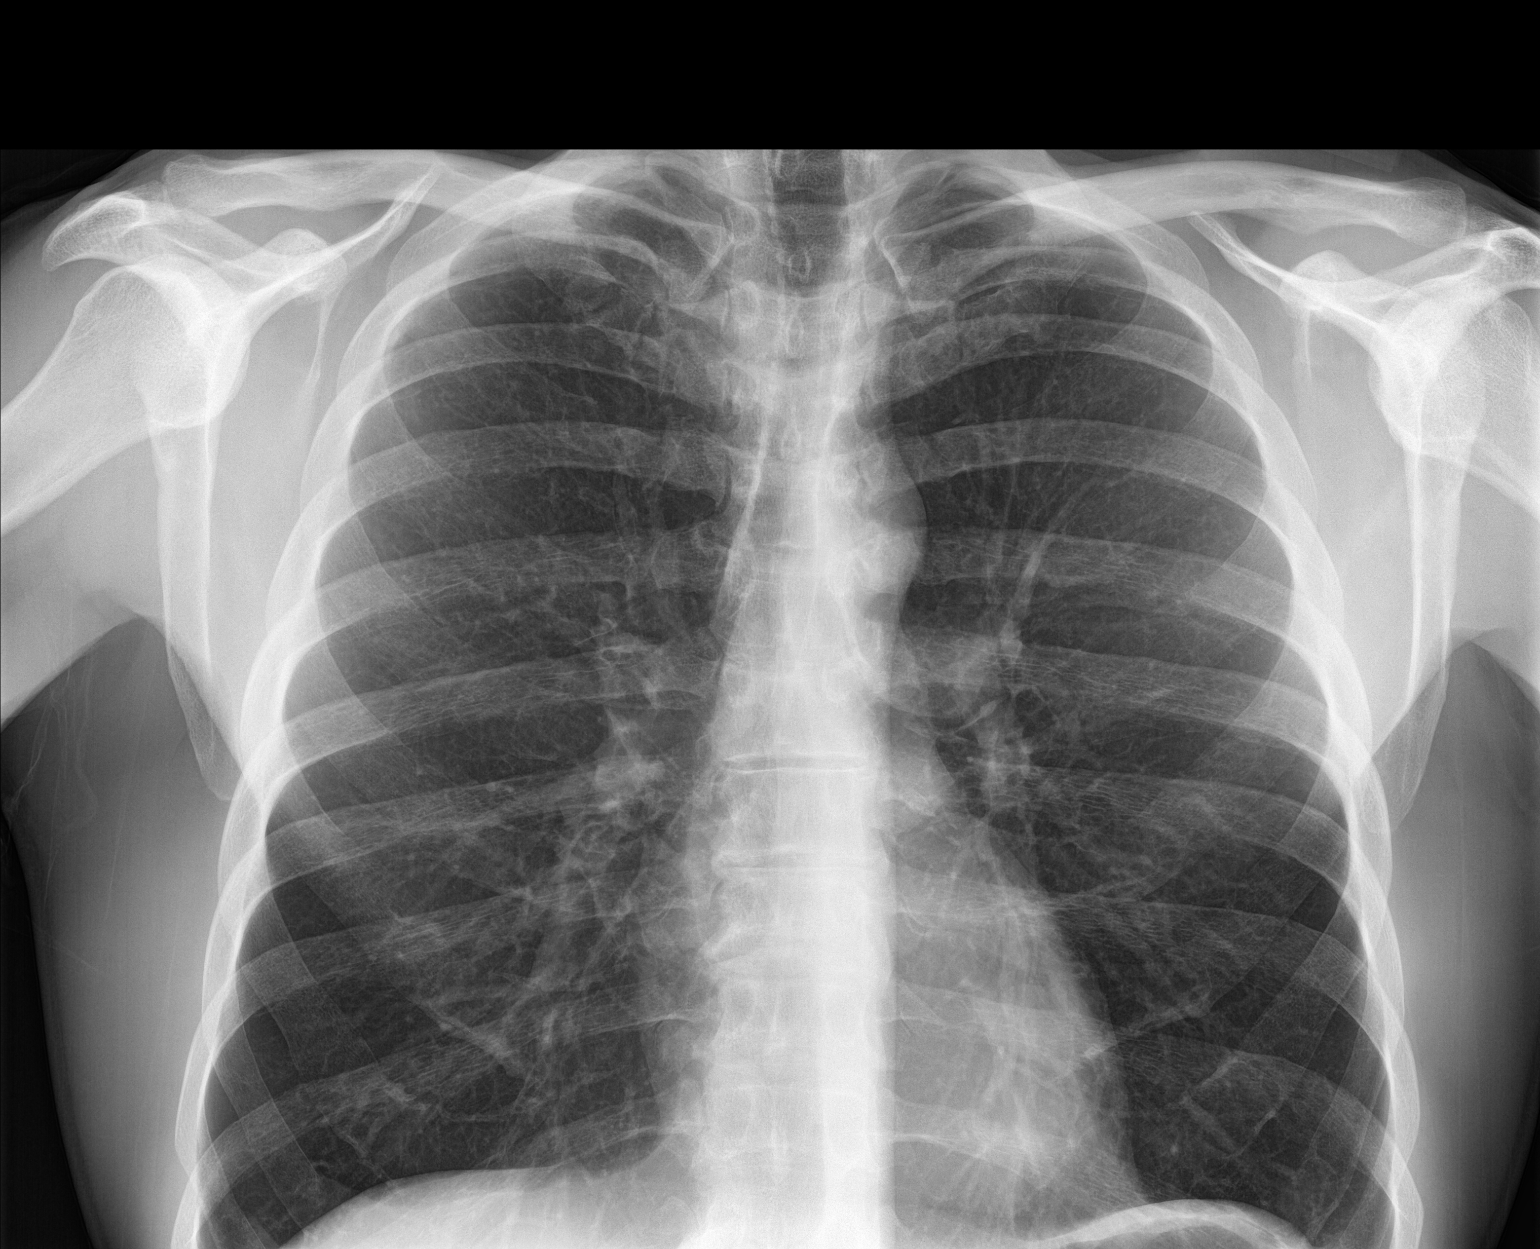

[chest pa (2 of 2)]
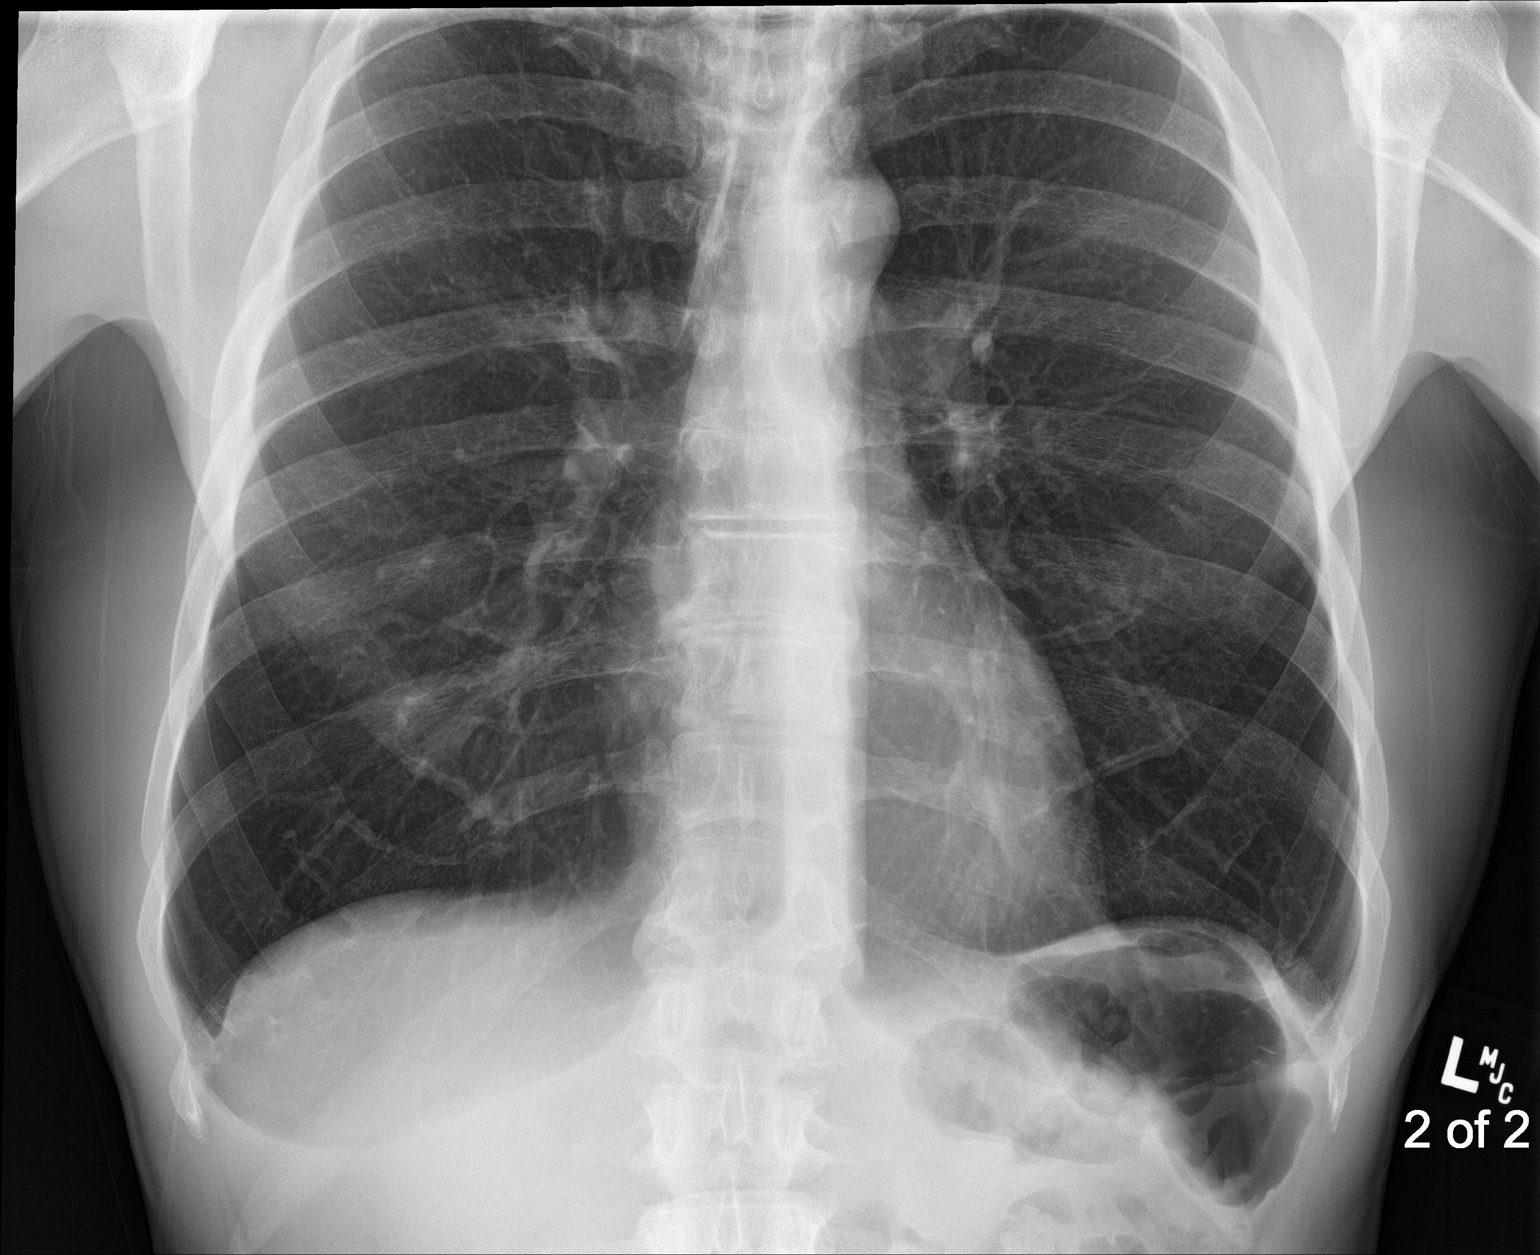

[chest lat]
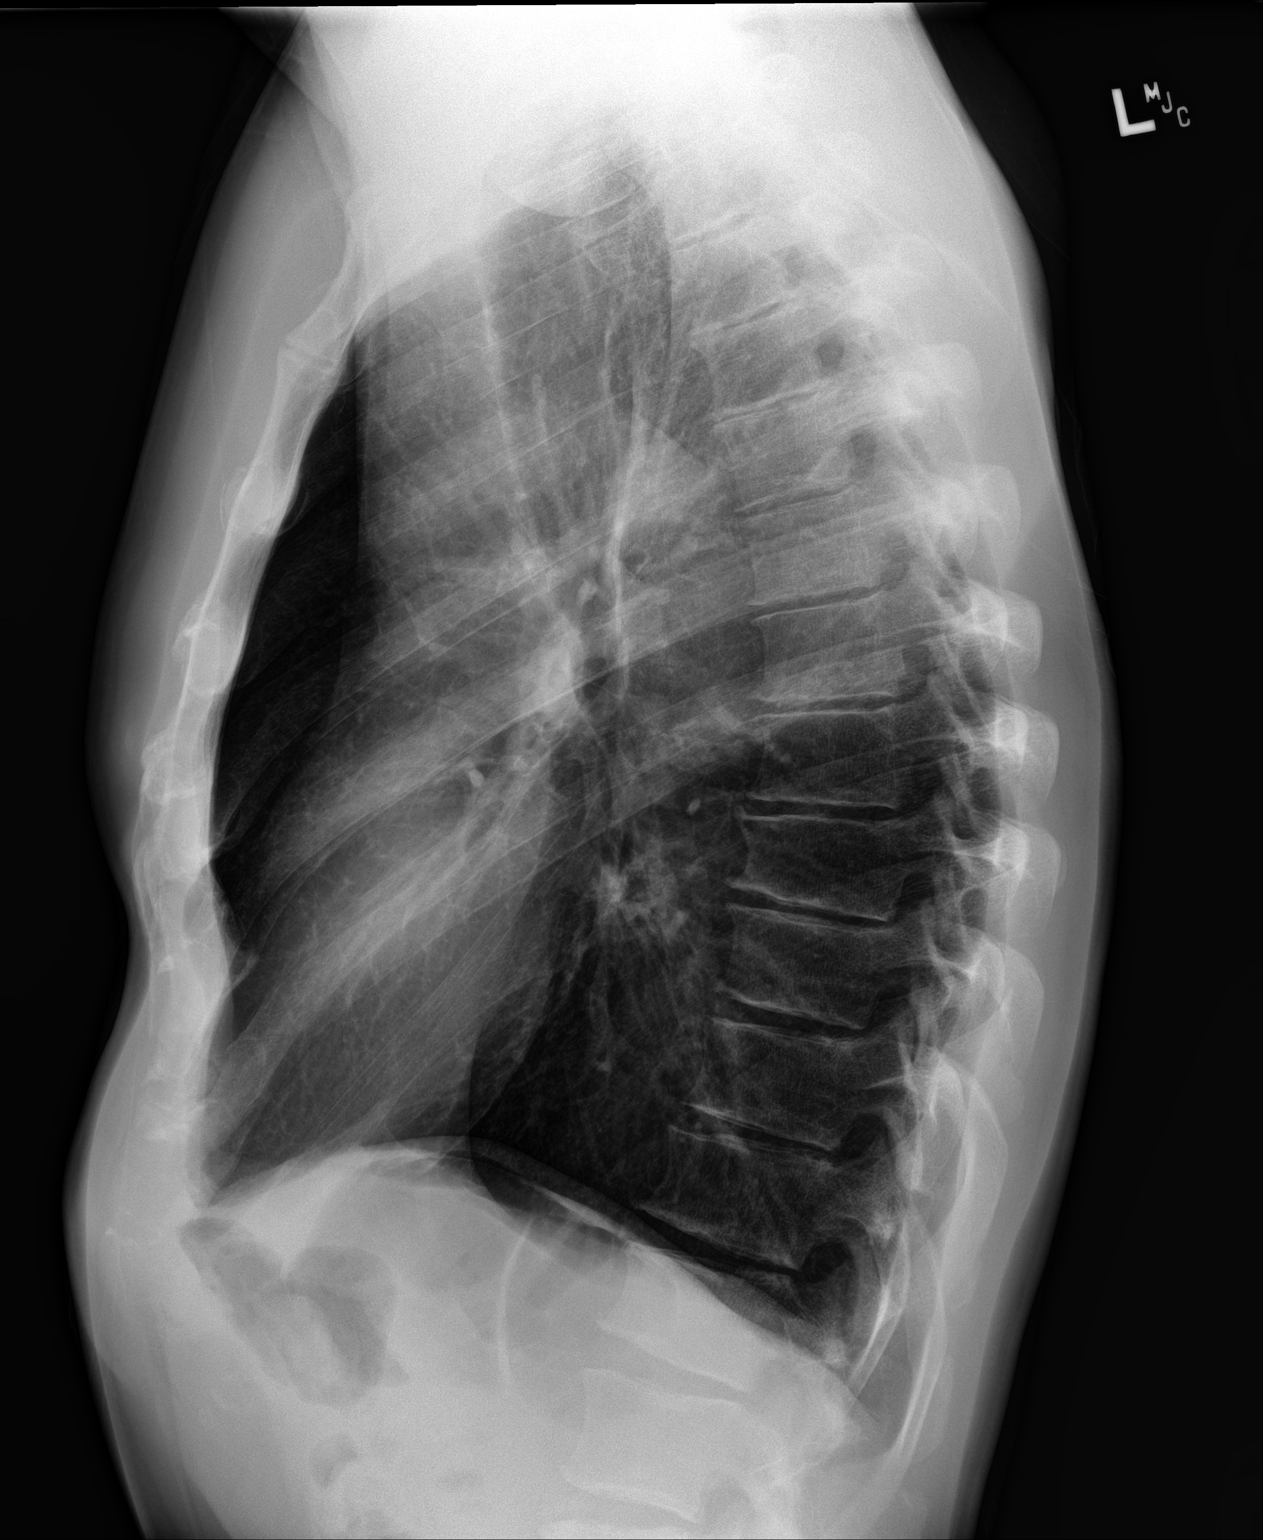

[3 of 3 positions shown; findings below may reference images not displayed]

FINDINGS: Lungs are hyperinflated. Heart size is normal. There are no focal
consolidations or pleural effusions. No pulmonary edema.
IMPRESSION: Stable hyperinflation.  No evidence for acute pulmonary abnormality.

## 2018-10-27 ENCOUNTER — Other Ambulatory Visit: Payer: Self-pay | Admitting: Internal Medicine

## 2018-10-27 DIAGNOSIS — J4520 Mild intermittent asthma, uncomplicated: Secondary | ICD-10-CM

## 2019-02-22 NOTE — Progress Notes (Signed)
Annual  Screening/Preventative Visit  & Comprehensive Evaluation & Examination     This very nice 59 y.o.male presents for a Screening /Preventative Visit & comprehensive evaluation and management of multiple medical co-morbidities.  Patient has been followed expectantly for labile HTN, HLD, Prediabetes and Vitamin D Deficiency. Patient has hx/o Allergic Asthma.       Because of his  long smoking history of over 40 pk years,  I discussed lung cancer screening with him & he was agreeable to undergo a screening low dose CT scan of the chest.  We discussed smoking cessation techniques/options. I will refer him  for a LDCT lung scan & lung cancer screening program     Labile HTN predates circa 2000. Patient's BP has been controlled and today's BP is borderline high normal diastolic BP -  126/90. Patient denies any cardiac symptoms as chest pain, palpitations, shortness of breath, dizziness or ankle swelling.     Patient's hyperlipidemia is controlled with diet and medications. Patient denies myalgias or other medication SE's. Last lipids were near goal:  Lab Results  Component Value Date   CHOL 199 02/03/2018   HDL 83 02/03/2018   LDLCALC 103 (H) 02/03/2018   TRIG 52 02/03/2018   CHOLHDL 2.4 02/03/2018      Patient has hx/o prediabetes (A1c 5.7% / 2012)and patient denies reactive hypoglycemic symptoms, visual blurring, diabetic polys or paresthesias. Last A1c was not at goal:  Lab Results  Component Value Date   HGBA1C 5.8 (H) 02/03/2018       Finally, patient has history of Vitamin D Deficiency ("37"/ 2008 & "35" / 2012) and last vitamin D was at goal: Lab Results  Component Value Date   VD25OH 69 02/03/2018   Current Outpatient Medications on File Prior to Visit  Medication Sig  . albuterol (VENTOLIN HFA) 108 (90 Base) MCG/ACT inhaler "Use 2 inhalations 15 minutes apart every 4 hours if needed to Rescue Asthma"  . BREO ELLIPTA 100-25 MCG/INH AEPB INHALE ONE DOSE BY MOUTH DAILY    . Cholecalciferol (VITAMIN D PO) Take 4,000 Units by mouth daily.  . Multiple Vitamin (MULTIVITAMIN) tablet Take 1 tablet by mouth daily.  [name] OVER THE COUNTER MEDICATION Protein powder  . vitamin C (ASCORBIC ACID) 500 MG tablet Take 500 mg by mouth daily.   No current facility-administered medications on file prior to visit.   Allergies  Allergen Reactions  . Trovan [Alatrofloxacin] Other (See Comments)    Light-headed feeling   Past Medical History:  Diagnosis Date  . Allergy    Health Maintenance  Topic Date Due  . INFLUENZA VACCINE  09/18/2018  . COLONOSCOPY  11/08/2023  . TETANUS/TDAP  12/19/2027  . Hepatitis C Screening  Completed  . HIV Screening  Completed   Immunization History  Administered Date(s) Administered  . DTaP 03/21/2008  . PPD Test 09/27/2013, 11/06/2015, 12/23/2016, 02/03/2018, 02/23/2019  . Pneumococcal-Unspecified 12/18/1989  . Tdap 12/18/2017   Last Colon -  11/07/2013 - Dr Arlyce Dice - Recc 10 yr f/u due Sept 2025  Past Surgical History:  Procedure Laterality Date  . TONSILLECTOMY     age 36  . wisdom teeth     Family History  Problem Relation Age of Onset  . Heart disease Mother   . Diabetes Father   . Hypertension Father   . Colon cancer Neg Hx   . Esophageal cancer Neg Hx   . Rectal cancer Neg Hx   . Stomach cancer Neg Hx   .  Pancreatic cancer Neg Hx   . Prostate cancer Neg Hx    Social History   Socioeconomic History  . Marital status: Married    Spouse name: Trish Fountain  . Number of children: 2 daughters  Occupational History  . 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 Smoking status: Current Every Day Smoker    Packs/day: 0.50    Types: Cigarettes  . Smokeless tobacco: Former Network engineer and Sexual Activity  . Alcohol use: Yes    Alcohol/week: 10.0 standard drinks    Types: 10 drink(s) per week    Comment: weekends  . Drug use: No  . Sexual activity: Not on file    ROS Constitutional: Denies fever,  chills, weight loss/gain, headaches, insomnia,  night sweats or change in appetite. Does c/o fatigue. Eyes: Denies redness, blurred vision, diplopia, discharge, itchy or watery eyes.  ENT: Denies discharge, congestion, post nasal drip, epistaxis, sore throat, earache, hearing loss, dental pain, Tinnitus, Vertigo, Sinus pain or snoring.  Cardio: Denies chest pain, palpitations, irregular heartbeat, syncope, dyspnea, diaphoresis, orthopnea, PND, claudication or edema Respiratory: denies cough, dyspnea, DOE, pleurisy, hoarseness, laryngitis or wheezing.  Gastrointestinal: Denies dysphagia, heartburn, reflux, water brash, pain, cramps, nausea, vomiting, bloating, diarrhea, constipation, hematemesis, melena, hematochezia, jaundice or hemorrhoids Genitourinary: Denies dysuria, frequency, urgency, nocturia, hesitancy, discharge, hematuria or flank pain Musculoskeletal: Denies arthralgia, myalgia, stiffness, Jt. Swelling, pain, limp or strain/sprain. Denies Falls. Skin: Denies puritis, rash, hives, warts, acne, eczema or change in skin lesion Neuro: No weakness, tremor, incoordination, spasms, paresthesia or pain Psychiatric: Denies confusion, memory loss or sensory loss. Denies Depression. Endocrine: Denies change in weight, skin, hair change, nocturia, and paresthesia, diabetic polys, visual blurring or hyper / hypo glycemic episodes.  Heme/Lymph: No excessive bleeding, bruising or enlarged lymph nodes.  Physical Exam  BP 126/90   Pulse 76   Temp (!) 97 F (36.1 C)   Resp 16   Ht 5' 8.25" (1.734 m)   Wt 148 lb (67.1 kg)   BMI 22.34 kg/m   General Appearance: Well nourished and well groomed and in no apparent distress.  Eyes: PERRLA, EOMs, conjunctiva no swelling or erythema, normal fundi and vessels. Sinuses: No frontal/maxillary tenderness ENT/Mouth: EACs patent / TMs  nl. Nares clear without erythema, swelling, mucoid exudates. Oral hygiene is good. No erythema, swelling, or exudate. Tongue  normal, non-obstructing. Tonsils not swollen or erythematous. Hearing normal.  Neck: Supple, thyroid not palpable. No bruits, nodes or JVD. Respiratory: Respiratory effort normal.  BS equal and clear bilateral without rales, rhonci, wheezing or stridor. Cardio: Heart sounds are normal with regular rate and rhythm and no murmurs, rubs or gallops. Peripheral pulses are normal and equal bilaterally without edema. No aortic or femoral bruits. Chest: symmetric with normal excursions and percussion.  Abdomen: Soft, with Nl bowel sounds. Nontender, no guarding, rebound, hernias, masses, or organomegaly.  Lymphatics: Non tender without lymphadenopathy.  Musculoskeletal: Full ROM all peripheral extremities, joint stability, 5/5 strength, and normal gait. Skin: Warm and dry without rashes, lesions, cyanosis, clubbing or  ecchymosis.  Neuro: Cranial nerves intact, reflexes equal bilaterally. Normal muscle tone, no cerebellar symptoms. Sensation intact.  Pysch: Alert and oriented X 3 with normal affect, insight and judgment appropriate.   Assessment and Plan  1. Annual Preventative/Screening Exam   1. Encounter for general adult medical examination with abnormal findings   2. Labile hypertension  - EKG 12-Lead - [place], retroperitnl abd,  ltd - Urinalysis, Routine w  reflex microscopic - Microalbumin / Creatinine Urine Ratio - CBC with Diff - COMPLETE METABOLIC PANEL WITH GFR - Magnesium - TSH  3. Hyperlipidemia, mixed  - EKG 12-Lead - [place], retroperitnl abd,  ltd - Lipid Profile - TSH  4. Abnormal glucose  - EKG 12-Lead - [place], retroperitnl abd,  ltd - Hemoglobin A1c ([facility]) - Insulin, random  5. Vitamin D deficiency  - Vitamin D (25 hydroxy)  6. Prediabetes  - EKG 12-Lead - [place], retroperitnl abd,  ltd - Hemoglobin A1c ([facility]) - Insulin, random  7. Testosterone deficiency  - Testosterone, Total  8. Encounter for screening for lung cancer  - CT CHEST LUNG CA SCREEN LOW DOSE  W/O CM; Future  9. Screening for colorectal cancer  - POC Hemoccult Bld/Stl (3-Cd Home Screen); Future  10. Screening examination for pulmonary tuberculosis  - TB Skin Test  11. Prostate cancer screening  - PSA  12. Screening for ischemic heart disease  - EKG 12-Lead  13. FHx: heart disease  - EKG 12-Lead - [place], retroperitnl abd,  ltd  14. Smoker  - EKG 12-Lead - [place], retroperitnl abd,  ltd  15. Screening for AAA (aortic abdominal aneurysm)  - [place], retroperitnl abd,  ltd  16. Fatigue, unspecified type  - Iron,Total/Total Iron Binding Cap - Vitamin B12 - CBC with Diff - TSH  17. Medication management  - Urinalysis, Routine w reflex microscopic - Microalbumin / Creatinine Urine Ratio - CBC with Diff - COMPLETE METABOLIC PANEL WITH GFR - Magnesium - Lipid Profile - TSH - Hemoglobin A1c ([facility]) - Insulin, random - Vitamin D (25 hydroxy)  18. Intermittent asthma without complication, unspecified asthma severity  - predniSONE (DELTASONE) 20 MG tablet; 1 tab 3 x day for 3 days, then 1 tab 2 x day for 3 days, then 1 tab 1 x day for 5 days  Dispense: 20 tablet; Refill: 0            Patient was counseled in prudent diet, weight control to achieve/maintain BMI less than 25, BP monitoring, regular exercise and medications as discussed.  Discussed med effects and SE's. Routine screening labs and tests as requested with regular follow-up as recommended. Over 40 minutes of exam, counseling, chart review and high complex critical decision making was performed   Marinus Maw, MD

## 2019-02-22 NOTE — Patient Instructions (Addendum)

## 2019-02-23 ENCOUNTER — Encounter: Payer: Self-pay | Admitting: Internal Medicine

## 2019-02-23 ENCOUNTER — Ambulatory Visit (INDEPENDENT_AMBULATORY_CARE_PROVIDER_SITE_OTHER): Payer: BC Managed Care – PPO | Admitting: Internal Medicine

## 2019-02-23 ENCOUNTER — Other Ambulatory Visit: Payer: Self-pay

## 2019-02-23 VITALS — BP 126/90 | HR 76 | Temp 97.0°F | Resp 16 | Ht 68.25 in | Wt 148.0 lb

## 2019-02-23 DIAGNOSIS — Z125 Encounter for screening for malignant neoplasm of prostate: Secondary | ICD-10-CM | POA: Diagnosis not present

## 2019-02-23 DIAGNOSIS — Z0000 Encounter for general adult medical examination without abnormal findings: Secondary | ICD-10-CM | POA: Diagnosis not present

## 2019-02-23 DIAGNOSIS — Z1329 Encounter for screening for other suspected endocrine disorder: Secondary | ICD-10-CM

## 2019-02-23 DIAGNOSIS — Z111 Encounter for screening for respiratory tuberculosis: Secondary | ICD-10-CM

## 2019-02-23 DIAGNOSIS — R0989 Other specified symptoms and signs involving the circulatory and respiratory systems: Secondary | ICD-10-CM | POA: Diagnosis not present

## 2019-02-23 DIAGNOSIS — E559 Vitamin D deficiency, unspecified: Secondary | ICD-10-CM

## 2019-02-23 DIAGNOSIS — I10 Essential (primary) hypertension: Secondary | ICD-10-CM

## 2019-02-23 DIAGNOSIS — Z131 Encounter for screening for diabetes mellitus: Secondary | ICD-10-CM | POA: Diagnosis not present

## 2019-02-23 DIAGNOSIS — Z0001 Encounter for general adult medical examination with abnormal findings: Secondary | ICD-10-CM

## 2019-02-23 DIAGNOSIS — Z130 Encounter for screening for diseases of the blood and blood-forming organs and certain disorders involving the immune mechanism: Secondary | ICD-10-CM

## 2019-02-23 DIAGNOSIS — R7309 Other abnormal glucose: Secondary | ICD-10-CM

## 2019-02-23 DIAGNOSIS — Z1389 Encounter for screening for other disorder: Secondary | ICD-10-CM

## 2019-02-23 DIAGNOSIS — R350 Frequency of micturition: Secondary | ICD-10-CM | POA: Diagnosis not present

## 2019-02-23 DIAGNOSIS — Z8249 Family history of ischemic heart disease and other diseases of the circulatory system: Secondary | ICD-10-CM

## 2019-02-23 DIAGNOSIS — N401 Enlarged prostate with lower urinary tract symptoms: Secondary | ICD-10-CM

## 2019-02-23 DIAGNOSIS — Z79899 Other long term (current) drug therapy: Secondary | ICD-10-CM

## 2019-02-23 DIAGNOSIS — F17200 Nicotine dependence, unspecified, uncomplicated: Secondary | ICD-10-CM

## 2019-02-23 DIAGNOSIS — E782 Mixed hyperlipidemia: Secondary | ICD-10-CM

## 2019-02-23 DIAGNOSIS — E349 Endocrine disorder, unspecified: Secondary | ICD-10-CM

## 2019-02-23 DIAGNOSIS — R5383 Other fatigue: Secondary | ICD-10-CM

## 2019-02-23 DIAGNOSIS — J4520 Mild intermittent asthma, uncomplicated: Secondary | ICD-10-CM

## 2019-02-23 DIAGNOSIS — Z136 Encounter for screening for cardiovascular disorders: Secondary | ICD-10-CM

## 2019-02-23 DIAGNOSIS — Z13220 Encounter for screening for lipoid disorders: Secondary | ICD-10-CM

## 2019-02-23 DIAGNOSIS — Z1211 Encounter for screening for malignant neoplasm of colon: Secondary | ICD-10-CM

## 2019-02-23 DIAGNOSIS — Z122 Encounter for screening for malignant neoplasm of respiratory organs: Secondary | ICD-10-CM

## 2019-02-23 DIAGNOSIS — R7303 Prediabetes: Secondary | ICD-10-CM

## 2019-02-23 MED ORDER — PREDNISONE 20 MG PO TABS: ORAL_TABLET | ORAL | 0 refills | Status: DC

## 2019-02-23 MED ORDER — MONTELUKAST SODIUM 10 MG PO TABS: ORAL_TABLET | ORAL | 3 refills | Status: DC

## 2019-02-24 LAB — CBC WITH DIFFERENTIAL/PLATELET
Absolute Monocytes: 874 cells/uL (ref 200–950)
Basophils Absolute: 27 cells/uL (ref 0–200)
Basophils Relative: 0.3 %
Eosinophils Absolute: 255 cells/uL (ref 15–500)
Eosinophils Relative: 2.8 %
HCT: 48.2 % (ref 38.5–50.0)
Hemoglobin: 16.4 g/dL (ref 13.2–17.1)
Lymphs Abs: 2002 cells/uL (ref 850–3900)
MCH: 31.8 pg (ref 27.0–33.0)
MCHC: 34 g/dL (ref 32.0–36.0)
MCV: 93.6 fL (ref 80.0–100.0)
MPV: 10.9 fL (ref 7.5–12.5)
Monocytes Relative: 9.6 %
Neutro Abs: 5942 cells/uL (ref 1500–7800)
Neutrophils Relative %: 65.3 %
Platelets: 259 10*3/uL (ref 140–400)
RBC: 5.15 10*6/uL (ref 4.20–5.80)
RDW: 13.2 % (ref 11.0–15.0)
Total Lymphocyte: 22 %
WBC: 9.1 10*3/uL (ref 3.8–10.8)

## 2019-02-24 LAB — LIPID PANEL
Cholesterol: 217 mg/dL — ABNORMAL HIGH (ref <200)
HDL: 90 mg/dL (ref >=40)
LDL Cholesterol (Calc): 114 mg/dL (calc) — ABNORMAL HIGH
Non-HDL Cholesterol (Calc): 127 mg/dL (calc) (ref <130)
Total CHOL/HDL Ratio: 2.4 (calc) (ref <5.0)
Triglycerides: 45 mg/dL (ref <150)

## 2019-02-24 LAB — URINALYSIS, ROUTINE W REFLEX MICROSCOPIC
Bilirubin Urine: NEGATIVE
Glucose, UA: NEGATIVE
Hgb urine dipstick: NEGATIVE
Ketones, ur: NEGATIVE
Leukocytes,Ua: NEGATIVE
Nitrite: NEGATIVE
Protein, ur: NEGATIVE
Specific Gravity, Urine: 1.02 (ref 1.001–1.03)
pH: <=5 (ref 5.0–8.0)

## 2019-02-24 LAB — PSA: PSA: 0.5 ng/mL (ref <=4.0)

## 2019-02-24 LAB — COMPLETE METABOLIC PANEL WITH GFR
AG Ratio: 2.1 (calc) (ref 1.0–2.5)
ALT: 20 U/L (ref 9–46)
AST: 21 U/L (ref 10–35)
Albumin: 4.5 g/dL (ref 3.6–5.1)
Alkaline phosphatase (APISO): 39 U/L (ref 35–144)
BUN: 16 mg/dL (ref 7–25)
CO2: 28 mmol/L (ref 20–32)
Calcium: 9.5 mg/dL (ref 8.6–10.3)
Chloride: 105 mmol/L (ref 98–110)
Creat: 1.15 mg/dL (ref 0.70–1.33)
GFR, Est African American: 80 mL/min/{1.73_m2} (ref >=60)
GFR, Est Non African American: 69 mL/min/{1.73_m2} (ref >=60)
Globulin: 2.1 g/dL (calc) (ref 1.9–3.7)
Glucose, Bld: 89 mg/dL (ref 65–99)
Potassium: 4.1 mmol/L (ref 3.5–5.3)
Sodium: 140 mmol/L (ref 135–146)
Total Bilirubin: 0.5 mg/dL (ref 0.2–1.2)
Total Protein: 6.6 g/dL (ref 6.1–8.1)

## 2019-02-24 LAB — MICROALBUMIN / CREATININE URINE RATIO
Creatinine, Urine: 125 mg/dL (ref 20–320)
Microalb, Ur: <0.2 mg/dL

## 2019-02-24 LAB — VITAMIN B12: Vitamin B-12: 335 pg/mL (ref 200–1100)

## 2019-02-24 LAB — IRON, TOTAL/TOTAL IRON BINDING CAP
%SAT: 33 % (calc) (ref 20–48)
Iron: 106 ug/dL (ref 50–180)
TIBC: 324 mcg/dL (calc) (ref 250–425)

## 2019-02-24 LAB — MAGNESIUM: Magnesium: 2.2 mg/dL (ref 1.5–2.5)

## 2019-02-24 LAB — HEMOGLOBIN A1C
Hgb A1c MFr Bld: 5.8 % of total Hgb — ABNORMAL HIGH (ref <5.7)
Mean Plasma Glucose: 120 (calc)
eAG (mmol/L): 6.6 (calc)

## 2019-02-24 LAB — INSULIN, RANDOM: Insulin: 7.2 u[IU]/mL

## 2019-02-24 LAB — TSH: TSH: 1.22 mIU/L (ref 0.40–4.50)

## 2019-02-24 LAB — VITAMIN D 25 HYDROXY (VIT D DEFICIENCY, FRACTURES): Vit D, 25-Hydroxy: 50 ng/mL (ref 30–100)

## 2019-02-24 LAB — TESTOSTERONE: Testosterone: 656 ng/dL (ref 250–827)

## 2019-03-02 ENCOUNTER — Encounter: Payer: Self-pay | Admitting: Internal Medicine

## 2019-03-04 ENCOUNTER — Ambulatory Visit
Admission: RE | Admit: 2019-03-04 | Discharge: 2019-03-04 | Disposition: A | Discharge status: Home / Self Care | Payer: BC Managed Care – PPO | Source: Ambulatory Visit | Attending: Internal Medicine | Admitting: Internal Medicine

## 2019-03-04 DIAGNOSIS — F17210 Nicotine dependence, cigarettes, uncomplicated: Secondary | ICD-10-CM | POA: Diagnosis not present

## 2019-03-04 DIAGNOSIS — Z122 Encounter for screening for malignant neoplasm of respiratory organs: Secondary | ICD-10-CM

## 2019-03-07 ENCOUNTER — Encounter: Payer: Self-pay | Admitting: Internal Medicine

## 2019-03-07 DIAGNOSIS — J439 Emphysema, unspecified: Secondary | ICD-10-CM | POA: Insufficient documentation

## 2019-03-07 DIAGNOSIS — I700 Atherosclerosis of aorta: Secondary | ICD-10-CM | POA: Insufficient documentation

## 2019-04-28 ENCOUNTER — Telehealth: Payer: Self-pay | Admitting: *Deleted

## 2019-04-28 NOTE — Telephone Encounter (Signed)
Patient called and asked if he should get the Covid 19 vaccine.  Per Dr Oneta Rack, a message was left to inform the patient he advised he receive the vaccine, when it becomes available for him.

## 2019-09-23 ENCOUNTER — Telehealth: Payer: Self-pay | Admitting: Physician Assistant

## 2019-09-23 NOTE — Telephone Encounter (Signed)
REQUESTING COVID [facility]AX GENERAL INFORMATION. CAN YOU SEND VIA MY CHART. DID NOT HAVE SPECIFIC QUESTIONS.

## 2019-11-25 ENCOUNTER — Other Ambulatory Visit: Payer: Self-pay | Admitting: Internal Medicine

## 2019-11-25 DIAGNOSIS — J4520 Mild intermittent asthma, uncomplicated: Secondary | ICD-10-CM

## 2020-01-19 ENCOUNTER — Other Ambulatory Visit: Payer: Self-pay | Admitting: Internal Medicine

## 2020-01-19 DIAGNOSIS — J4520 Mild intermittent asthma, uncomplicated: Secondary | ICD-10-CM

## 2020-03-06 ENCOUNTER — Encounter: Payer: BLUE CROSS/BLUE SHIELD | Admitting: Internal Medicine

## 2020-03-20 ENCOUNTER — Other Ambulatory Visit: Payer: Self-pay | Admitting: *Deleted

## 2020-03-20 DIAGNOSIS — J4520 Mild intermittent asthma, uncomplicated: Secondary | ICD-10-CM

## 2020-03-20 MED ORDER — ALBUTEROL SULFATE HFA 108 (90 BASE) MCG/ACT IN AERS: INHALATION_SPRAY | RESPIRATORY_TRACT | 0 refills | Status: DC

## 2020-06-15 ENCOUNTER — Other Ambulatory Visit: Payer: Self-pay | Admitting: Internal Medicine

## 2020-06-15 ENCOUNTER — Telehealth: Payer: Self-pay | Admitting: *Deleted

## 2020-06-15 DIAGNOSIS — J0101 Acute recurrent maxillary sinusitis: Secondary | ICD-10-CM

## 2020-06-15 DIAGNOSIS — J4520 Mild intermittent asthma, uncomplicated: Secondary | ICD-10-CM

## 2020-06-15 MED ORDER — AZITHROMYCIN 250 MG PO TABS: ORAL_TABLET | ORAL | 1 refills | Status: DC

## 2020-06-15 MED ORDER — MONTELUKAST SODIUM 10 MG PO TABS
ORAL_TABLET | ORAL | 3 refills | Status: DC
Start: 2020-06-15 — End: 2021-07-10

## 2020-06-15 MED ORDER — DEXAMETHASONE 4 MG PO TABS: ORAL_TABLET | ORAL | 0 refills | Status: DC

## 2020-06-15 NOTE — Telephone Encounter (Signed)
Patient called and complained of sinus congestion with slightly yellow mucus. Dr Oneta Rack sent in an RX for an antibiotic to the patient's pharmacy. Patient is aware.

## 2020-06-19 ENCOUNTER — Telehealth: Payer: Self-pay | Admitting: *Deleted

## 2020-06-19 NOTE — Telephone Encounter (Signed)
Patient called to report his congestion continues, but clear mucus and did not fill the Z-pak from 06/15/2020. Pharmacy has the RX on file.

## 2020-07-09 ENCOUNTER — Encounter: Payer: Self-pay | Admitting: Internal Medicine

## 2020-07-09 NOTE — Progress Notes (Signed)
Annual  Screening/Preventative Visit  & Comprehensive Evaluation & Examination  Future Appointments  Date Time Provider Department Center  07/10/2020 11:00 AM Lucky Cowboy, MD [facility] None  07/10/2021 11:00 AM Lucky Cowboy, MD [facility] None            This very nice 60 y.o. MWM presents for a Screening /Preventative Visit & comprehensive evaluation and management of multiple medical co-morbidities.  Patient has been followed for HTN, HLD, Prediabetes and Vitamin D Deficiency. Patient also has hx/o Allergic Asthma. Patient had LD Chest CT scan on 03/04/2019 showing Aortic Atherosclerosis.       The patient has a  40+pk year smoking hx and I discussed lung cancer screening with him & he was agreeable to undergo a screening low dose CT scan of the chest.  He had neg LD screening Chest CT for the last 2 years.  We discussed smoking cessation techniques/options. I will refer him for a LDCT lung scan &lung cancer screening program       Patient has been followed expectantly since 2000 for labile HTN. Patient's BP has been controlled at home.  Today's BP s at goal - 112/80. Patient denies any cardiac symptoms as chest pain, palpitations, shortness of breath, dizziness or ankle swelling.       Patient's hyperlipidemia is not controlled with diet. Last lipids were not at goal:  Lab Results  Component Value Date   CHOL 217 (H) 02/23/2019   HDL 90 02/23/2019   LDLCALC 114 (H) 02/23/2019   TRIG 45 02/23/2019   CHOLHDL 2.4 02/23/2019        Patient has hx/o prediabetes (A1c 5.7% /2012)and patient denies reactive hypoglycemic symptoms, visual blurring, diabetic polys or paresthesias. Last A1c was near goal:   Lab Results  Component Value Date   HGBA1C 5.8 (H) 02/23/2019         Finally, patient has history of Vitamin D Deficiency ("37"/2008& "27" /2012) and last vitamin D was not at goal ( 70-100):    Lab Results  Component Value Date   VD25OH 50 02/23/2019     Current Outpatient Medications on File Prior to Visit  Medication Sig  . albuterol  HFA inhaler USE 2 INHALATIONS  EVERY 4 HOURS   . VITAMIN D  4,000 Units  Take  daily.  . montelukast  10 MG tablet Take 1 tablet Daily for Allergies & Asthma  . Multiple Vitamintablet Take 1 tabletdaily.  . Protein powder   . vitamin C  500 MG tablet Take  daily.     Allergies  Allergen Reactions  . Trovan [Alatrofloxacin] Other (See Comments)    Light-headed feeling    Health Maintenance  Topic Date Due  . COVID-19 Vaccine (1) Never done  . INFLUENZA VACCINE  09/17/2020  . COLONOSCOPY 11/08/2023  . TETANUS/TDAP  12/19/2027  . Hepatitis C Screening  Completed  . HIV Screening  Completed  . HPV VACCINES  Aged Out    Immunization History  Administered Date(s) Administered  . DTaP 03/21/2008  . PPD Test 09/27/2013, 11/06/2015, 12/23/2016, 02/03/2018, 02/23/2019  . Pneumococcal-Unspecified 12/18/1989  . Tdap 12/18/2017    Last Colon - 11/07/2013 - Dr Arlyce Dice - Recc 10 yr f/u due Sept 2025  Past Surgical History:  Procedure Laterality Date  . TONSILLECTOMY     age 83  . wisdom teeth  Family History  Problem Relation Age of Onset  . Heart disease Mother   . Diabetes Father   . Hypertension Father   . Colon cancer Neg Hx   . Esophageal cancer Neg Hx   . Rectal cancer Neg Hx   . Stomach cancer Neg Hx   . Pancreatic cancer Neg Hx   . Prostate cancer Neg Hx     Social History   Socioeconomic History  . Marital status: Married    Spouse name: Not on file  . Number of children: Not on file  Occupational History  . Not on file  Tobacco Use  . Smoking status: Current Every Day Smoker    Packs/day: 0.50    Types: Cigarettes  . Smokeless tobacco: Former Engineer, water and Sexual Activity  . Alcohol use: Yes    Alcohol/week: 10.0 standard drinks    Types: 10 drink(s) per week    Comment: weekends  . Drug use: No  . Sexual activity: Not  on file     ROS Constitutional: Denies fever, chills, weight loss/gain, headaches, insomnia,  night sweats or change in appetite. Does c/o fatigue. Eyes: Denies redness, blurred vision, diplopia, discharge, itchy or watery eyes.  ENT: Denies discharge, congestion, post nasal drip, epistaxis, sore throat, earache, hearing loss, dental pain, Tinnitus, Vertigo, Sinus pain or snoring.  Cardio: Denies chest pain, palpitations, irregular heartbeat, syncope, dyspnea, diaphoresis, orthopnea, PND, claudication or edema Respiratory: denies cough, dyspnea, DOE, pleurisy, hoarseness, laryngitis or wheezing.  Gastrointestinal: Denies dysphagia, heartburn, reflux, water brash, pain, cramps, nausea, vomiting, bloating, diarrhea, constipation, hematemesis, melena, hematochezia, jaundice or hemorrhoids Genitourinary: Denies dysuria, frequency, urgency, nocturia, hesitancy, discharge, hematuria or flank pain Musculoskeletal: Denies arthralgia, myalgia, stiffness, Jt. Swelling, pain, limp or strain/sprain. Denies Falls. Skin: Denies puritis, rash, hives, warts, acne, eczema or change in skin lesion Neuro: No weakness, tremor, incoordination, spasms, paresthesia or pain Psychiatric: Denies confusion, memory loss or sensory loss. Denies Depression. Endocrine: Denies change in weight, skin, hair change, nocturia, and paresthesia, diabetic polys, visual blurring or hyper / hypo glycemic episodes.  Heme/Lymph: No excessive bleeding, bruising or enlarged lymph nodes.   Physical Exam  BP 112/80   Pulse (!) 57   Temp (!) 97.3 F (36.3 C)   Resp 16   Ht 5\' 8"  (1.727 m)   Wt 151 lb 6.4 oz (68.7 kg)   SpO2 99%   BMI 23.02 kg/m   General Appearance: Well nourished and well groomed and in no apparent distress.  Eyes: PERRLA, EOMs, conjunctiva no swelling or erythema, normal fundi and vessels. Sinuses: No frontal/maxillary tenderness ENT/Mouth: EACs patent / TMs  nl. Nares clear without erythema, swelling, mucoid  exudates. Oral hygiene is good. No erythema, swelling, or exudate. Tongue normal, non-obstructing. Tonsils not swollen or erythematous. Hearing normal.  Neck: Supple, thyroid not palpable. No bruits, nodes or JVD. Respiratory: Respiratory effort normal.  BS equal and clear bilateral without rales, rhonci, wheezing or stridor. Cardio: Heart sounds are normal with regular rate and rhythm and no murmurs, rubs or gallops. Peripheral pulses are normal and equal bilaterally without edema. No aortic or femoral bruits. Chest: symmetric with normal excursions and percussion.  Abdomen: Soft, with Nl bowel sounds. Nontender, no guarding, rebound, hernias, masses, or organomegaly.  Lymphatics: Non tender without lymphadenopathy.  Musculoskeletal: Full ROM all peripheral extremities, joint stability, 5/5 strength, and normal gait. Skin: Warm and dry without rashes, lesions, cyanosis, clubbing or  ecchymosis.  Neuro: Cranial nerves intact, reflexes equal bilaterally. Normal muscle  tone, no cerebellar symptoms. Sensation intact.  Pysch: Alert and oriented X 3 with normal affect, insight and judgment appropriate.   Assessment and Plan  1. Annual Preventative/Screening Exam    2. Labile hypertension  - CBC with Differential/Platelet - COMPLETE METABOLIC PANEL WITH GFR - Magnesium - TSH - Urinalysis, Routine w reflex microscopic - Microalbumin / creatinine urine ratio - EKG 12-Lead - [place], RETROPERITNL ABD,  LTD  3. Hyperlipidemia, mixed  - Lipid panel - TSH - EKG 12-Lead - [place], RETROPERITNL ABD,  LTD  4. Abnormal glucose  - Hemoglobin A1c - Insulin, random - EKG 12-Lead - [place], RETROPERITNL ABD,  LTD  5. Vitamin D deficiency  - VITAMIN D 25 Hydroxy 6. Prediabetes  - Hemoglobin A1c - Insulin, random - EKG 12-Lead  7. Testosterone deficiency  - Testosterone  8. Intermittent asthma    9. Arthralgia, unspecified joint  - Uric acid  10. Encounter for screening for lung cancer  -  CT CHEST LUNG CA SCREEN LOW DOSE W/O CM  11. Screening for colorectal cancer  - POC Hemoccult Bld/Stl   12. Screening examination for pulmonary tuberculosis  - TB Skin Test  13. Prostate cancer screening  - PSA  14. Screening for ischemic heart disease  - EKG 12-Lead  15. FHx: heart disease  - EKG 12-Lead - [place], RETROPERITNL ABD,  LTD  16. Smoker  - EKG 12-Lead - [place], RETROPERITNL ABD,  LTD  17. Screening for AAA (aortic abdominal aneurysm)  - [place], RETROPERITNL ABD,  LTD  18. Fatigue, unspecified type  - CBC with Differential/Platelet - TSH - Iron,Total/Total Iron Binding Cap - Vitamin B12 - Testosterone  19. Medication management  - CBC with Differential/Platelet - COMPLETE METABOLIC PANEL WITH GFR - Magnesium - Lipid panel - TSH - Hemoglobin A1c - Insulin, random - VITAMIN D 25 Hydroxy  - Urinalysis, Routine w reflex microscopic - Microalbumin / creatinine urine ratio - PSA        Patient was counseled in prudent diet, weight control to achieve/maintain BMI less than 25, BP monitoring, regular exercise and medications as discussed.  Discussed med effects and SE's. Routine screening labs and tests as requested with regular follow-up as recommended. Over 40 minutes of exam, counseling, chart review and high complex critical decision making was performed   Marinus Maw, MD

## 2020-07-09 NOTE — Patient Instructions (Signed)

## 2020-07-10 ENCOUNTER — Ambulatory Visit (INDEPENDENT_AMBULATORY_CARE_PROVIDER_SITE_OTHER): Payer: BC Managed Care – PPO | Admitting: Internal Medicine

## 2020-07-10 ENCOUNTER — Other Ambulatory Visit: Payer: Self-pay

## 2020-07-10 VITALS — BP 112/80 | HR 57 | Temp 97.3°F | Resp 16 | Ht 68.0 in | Wt 151.4 lb

## 2020-07-10 DIAGNOSIS — Z1211 Encounter for screening for malignant neoplasm of colon: Secondary | ICD-10-CM

## 2020-07-10 DIAGNOSIS — Z1329 Encounter for screening for other suspected endocrine disorder: Secondary | ICD-10-CM | POA: Diagnosis not present

## 2020-07-10 DIAGNOSIS — Z0000 Encounter for general adult medical examination without abnormal findings: Secondary | ICD-10-CM | POA: Diagnosis not present

## 2020-07-10 DIAGNOSIS — M2550 Pain in unspecified joint: Secondary | ICD-10-CM

## 2020-07-10 DIAGNOSIS — Z79899 Other long term (current) drug therapy: Secondary | ICD-10-CM

## 2020-07-10 DIAGNOSIS — Z13220 Encounter for screening for lipoid disorders: Secondary | ICD-10-CM

## 2020-07-10 DIAGNOSIS — E559 Vitamin D deficiency, unspecified: Secondary | ICD-10-CM

## 2020-07-10 DIAGNOSIS — Z8249 Family history of ischemic heart disease and other diseases of the circulatory system: Secondary | ICD-10-CM | POA: Diagnosis not present

## 2020-07-10 DIAGNOSIS — R7303 Prediabetes: Secondary | ICD-10-CM

## 2020-07-10 DIAGNOSIS — R5383 Other fatigue: Secondary | ICD-10-CM

## 2020-07-10 DIAGNOSIS — R350 Frequency of micturition: Secondary | ICD-10-CM

## 2020-07-10 DIAGNOSIS — R0989 Other specified symptoms and signs involving the circulatory and respiratory systems: Secondary | ICD-10-CM

## 2020-07-10 DIAGNOSIS — Z1389 Encounter for screening for other disorder: Secondary | ICD-10-CM | POA: Diagnosis not present

## 2020-07-10 DIAGNOSIS — I700 Atherosclerosis of aorta: Secondary | ICD-10-CM

## 2020-07-10 DIAGNOSIS — Z130 Encounter for screening for diseases of the blood and blood-forming organs and certain disorders involving the immune mechanism: Secondary | ICD-10-CM

## 2020-07-10 DIAGNOSIS — F17200 Nicotine dependence, unspecified, uncomplicated: Secondary | ICD-10-CM

## 2020-07-10 DIAGNOSIS — Z136 Encounter for screening for cardiovascular disorders: Secondary | ICD-10-CM | POA: Diagnosis not present

## 2020-07-10 DIAGNOSIS — E349 Endocrine disorder, unspecified: Secondary | ICD-10-CM

## 2020-07-10 DIAGNOSIS — Z131 Encounter for screening for diabetes mellitus: Secondary | ICD-10-CM

## 2020-07-10 DIAGNOSIS — N401 Enlarged prostate with lower urinary tract symptoms: Secondary | ICD-10-CM

## 2020-07-10 DIAGNOSIS — Z0001 Encounter for general adult medical examination with abnormal findings: Secondary | ICD-10-CM

## 2020-07-10 DIAGNOSIS — R7309 Other abnormal glucose: Secondary | ICD-10-CM

## 2020-07-10 DIAGNOSIS — J4520 Mild intermittent asthma, uncomplicated: Secondary | ICD-10-CM

## 2020-07-10 DIAGNOSIS — Z0189 Encounter for other specified special examinations: Secondary | ICD-10-CM

## 2020-07-10 DIAGNOSIS — Z111 Encounter for screening for respiratory tuberculosis: Secondary | ICD-10-CM

## 2020-07-10 DIAGNOSIS — Z125 Encounter for screening for malignant neoplasm of prostate: Secondary | ICD-10-CM

## 2020-07-10 DIAGNOSIS — E782 Mixed hyperlipidemia: Secondary | ICD-10-CM

## 2020-07-10 DIAGNOSIS — Z122 Encounter for screening for malignant neoplasm of respiratory organs: Secondary | ICD-10-CM

## 2020-07-10 MED ORDER — DEXAMETHASONE 4 MG PO TABS
ORAL_TABLET | ORAL | 1 refills | Status: DC
Start: 2020-07-10 — End: 2020-07-26

## 2020-07-10 NOTE — Progress Notes (Signed)
AortaScan < 3 cm. Within normal limits, per Dr McKeown. 

## 2020-07-11 ENCOUNTER — Other Ambulatory Visit: Payer: Self-pay | Admitting: Internal Medicine

## 2020-07-11 DIAGNOSIS — R7309 Other abnormal glucose: Secondary | ICD-10-CM

## 2020-07-11 DIAGNOSIS — E782 Mixed hyperlipidemia: Secondary | ICD-10-CM

## 2020-07-11 DIAGNOSIS — E538 Deficiency of other specified B group vitamins: Secondary | ICD-10-CM

## 2020-07-11 LAB — MICROALBUMIN / CREATININE URINE RATIO
Creatinine, Urine: 17 mg/dL — ABNORMAL LOW (ref 20–320)
Microalb Creat Ratio: 12 mcg/mg creat (ref <30)
Microalb, Ur: 0.2 mg/dL

## 2020-07-11 LAB — IRON, TOTAL/TOTAL IRON BINDING CAP
%SAT: 32 % (calc) (ref 20–48)
Iron: 113 ug/dL (ref 50–180)
TIBC: 350 mcg/dL (calc) (ref 250–425)

## 2020-07-11 LAB — COMPLETE METABOLIC PANEL WITH GFR
AG Ratio: 1.9 (calc) (ref 1.0–2.5)
ALT: 21 U/L (ref 9–46)
AST: 22 U/L (ref 10–35)
Albumin: 4.5 g/dL (ref 3.6–5.1)
Alkaline phosphatase (APISO): 44 U/L (ref 35–144)
BUN: 15 mg/dL (ref 7–25)
CO2: 26 mmol/L (ref 20–32)
Calcium: 9.4 mg/dL (ref 8.6–10.3)
Chloride: 105 mmol/L (ref 98–110)
Creat: 1.03 mg/dL (ref 0.70–1.25)
GFR, Est African American: 91 mL/min/{1.73_m2} (ref >=60)
GFR, Est Non African American: 79 mL/min/{1.73_m2} (ref >=60)
Globulin: 2.4 g/dL (calc) (ref 1.9–3.7)
Glucose, Bld: 122 mg/dL — ABNORMAL HIGH (ref 65–99)
Potassium: 4.9 mmol/L (ref 3.5–5.3)
Sodium: 139 mmol/L (ref 135–146)
Total Bilirubin: 0.4 mg/dL (ref 0.2–1.2)
Total Protein: 6.9 g/dL (ref 6.1–8.1)

## 2020-07-11 LAB — MAGNESIUM: Magnesium: 2.3 mg/dL (ref 1.5–2.5)

## 2020-07-11 LAB — CBC WITH DIFFERENTIAL/PLATELET
Absolute Monocytes: 91 cells/uL — ABNORMAL LOW (ref 200–950)
Basophils Absolute: 10 cells/uL (ref 0–200)
Basophils Relative: 0.2 %
Eosinophils Absolute: 19 cells/uL (ref 15–500)
Eosinophils Relative: 0.4 %
HCT: 49.3 % (ref 38.5–50.0)
Hemoglobin: 16.7 g/dL (ref 13.2–17.1)
Lymphs Abs: 413 cells/uL — ABNORMAL LOW (ref 850–3900)
MCH: 31.5 pg (ref 27.0–33.0)
MCHC: 33.9 g/dL (ref 32.0–36.0)
MCV: 93 fL (ref 80.0–100.0)
MPV: 10.9 fL (ref 7.5–12.5)
Monocytes Relative: 1.9 %
Neutro Abs: 4267 cells/uL (ref 1500–7800)
Neutrophils Relative %: 88.9 %
Platelets: 223 10*3/uL (ref 140–400)
RBC: 5.3 10*6/uL (ref 4.20–5.80)
RDW: 13.3 % (ref 11.0–15.0)
Total Lymphocyte: 8.6 %
WBC: 4.8 10*3/uL (ref 3.8–10.8)

## 2020-07-11 LAB — PSA: PSA: 0.76 ng/mL (ref <=4.00)

## 2020-07-11 LAB — URINALYSIS, ROUTINE W REFLEX MICROSCOPIC
Bilirubin Urine: NEGATIVE
Glucose, UA: NEGATIVE
Hgb urine dipstick: NEGATIVE
Ketones, ur: NEGATIVE
Leukocytes,Ua: NEGATIVE
Nitrite: NEGATIVE
Protein, ur: NEGATIVE
Specific Gravity, Urine: 1.004 (ref 1.001–1.035)
pH: 7.5 (ref 5.0–8.0)

## 2020-07-11 LAB — TSH: TSH: 1.14 mIU/L (ref 0.40–4.50)

## 2020-07-11 LAB — INSULIN, RANDOM: Insulin: 3.9 u[IU]/mL

## 2020-07-11 LAB — LIPID PANEL
Cholesterol: 218 mg/dL — ABNORMAL HIGH (ref <200)
HDL: 85 mg/dL (ref >=40)
LDL Cholesterol (Calc): 121 mg/dL (calc) — ABNORMAL HIGH
Non-HDL Cholesterol (Calc): 133 mg/dL (calc) — ABNORMAL HIGH (ref <130)
Total CHOL/HDL Ratio: 2.6 (calc) (ref <5.0)
Triglycerides: 39 mg/dL (ref <150)

## 2020-07-11 LAB — URIC ACID: Uric Acid, Serum: 5.2 mg/dL (ref 4.0–8.0)

## 2020-07-11 LAB — VITAMIN D 25 HYDROXY (VIT D DEFICIENCY, FRACTURES): Vit D, 25-Hydroxy: 74 ng/mL (ref 30–100)

## 2020-07-11 LAB — TESTOSTERONE: Testosterone: 836 ng/dL — ABNORMAL HIGH (ref 250–827)

## 2020-07-11 LAB — HEMOGLOBIN A1C
Hgb A1c MFr Bld: 6.1 % of total Hgb — ABNORMAL HIGH (ref <5.7)
Mean Plasma Glucose: 128 mg/dL
eAG (mmol/L): 7.1 mmol/L

## 2020-07-11 LAB — VITAMIN B12: Vitamin B-12: 354 pg/mL (ref 200–1100)

## 2020-07-11 NOTE — Progress Notes (Signed)
============================================================ - Test results slightly outside the reference range are not unusual. If there is anything important, I will review this with you,  otherwise it is considered normal test values.  If you have further questions,  please do not hesitate to contact me at the office or via My Chart.  ============================================================ ============================================================  -  Total Chol = 218 - very elevated            (  Ideal or goal is less than 180  )   - and   - Bad  / Dangerous LDL Chol  = 121 - Also Very Elevated             (  Ideal or Goal is less than 70  !  )   - So lipid profile is high risk for developing                                                    Heart Attacks, Strokes & Vascular Dementia   - Suggest stricter diet  & recheck in 3 months & then if not significantly  better   ===========================================================  - Recommend a stricter low cholesterol diet   - Cholesterol only comes from animal sources  - ie. meat, dairy, egg yolks  - Eat all the vegetables you want.  - Avoid meat, especially red meat - Beef AND Pork .  - Avoid cheese & dairy - milk & ice cream.     - Cheese is the most concentrated form of trans-fats which  is the worst thing to clog up our arteries.   - Veggie cheese is OK which can be found in the fresh  produce section at [facility] or [employer] or [employer]\  - So Please call  office to schedule a 3 month  Lab visit to recheck  Cholesterol panel about the last week of August       ============================================================ ============================================================  -  A1c = 6.1% - is back up again - higher in the prediabetic range,        (  Ideal or Goal is Less than 5.7%  )   - So likewise need stricter diet, so won't need to start Diabetic  medicines ============================================================  Being diabetic has a  300% increased risk for heart attack,  stroke, cancer, and alzheimer- type vascular dementia.   It is very important that you work harder with diet by  avoiding all foods that are white except chicken,   fish & calliflower.  - Avoid white rice  (brown & wild rice is OK),   - Avoid white potatoes  (sweet potatoes in moderation is OK),   White bread or wheat bread or anything made out of   white flour like bagels, donuts, rolls, buns, biscuits, cakes,  - pastries, cookies, pizza crust, and pasta (made from  white flour & egg whites)   - vegetarian pasta or spinach or wheat pasta is OK.  - Multigrain breads like Arnold's, Pepperidge Farm or   multigrain sandwich thins or high fiber breads like   Eureka bread or "Dave's Killer" breads that are  4 to 5 grams fiber per slice !  are best.    Diet, exercise and weight loss can reverse and cure  diabetes in the early stages.    - Diet, exercise and  weight loss is very important in the   control and prevention of complications of diabetes which  affects every system in your body, ie.   -Brain - dementia/stroke,  - eyes - glaucoma/blindness,  - heart - heart attack/heart failure,  - kidneys - dialysis,  - stomach - gastric paralysis,  - intestines - malabsorption,  - nerves - severe painful neuritis,  - circulation - gangrene & loss of a leg(s)  - and finally  . . . . . . . . . . . . . . . . . .    - cancer and Alzheimers.  - Also will check A1c level at the late August lab appointment  ============================================================ ============================================================  - Vitamin D = 74 - Excellent  ============================================================ ============================================================  - Iron levels - Normal  & OK   ============================================================ ============================================================  -  -  Vitamin B12 =  354    Unfortunately  STILL  Very Low  (Ideal or Goal Vit B12 is between 450 - 1,100)   Low Vit B12 may be associated with Anemia , Fatigue,   Peripheral Neuropathy, Dementia, "Brain Fog", & Depression  - Recommend take a sub-lingual form of Vitamin B12 tablet   1,000 to 5,000 mcg tab that you dissolve under your tongue /Daily   - Can get Lavonia Dana - best price at [employer] or on [employer] ============================================================ ============================================================  - Testosterone -  Normal  ============================================================ ============================================================  - PSA - Very low - Great  ============================================================ ============================================================  - Uric Acid /Gout - Normal & OK   ============================================================ ============================================================  - All Else - CBC - Kidneys - Electrolytes - Liver - Magnesium & Thyroid    - all  Normal / OK ===========================================================

## 2020-07-25 LAB — TB SKIN TEST
Induration: 0 mm
TB Skin Test: NEGATIVE

## 2020-07-26 ENCOUNTER — Ambulatory Visit
Admission: RE | Admit: 2020-07-26 | Discharge: 2020-07-26 | Disposition: A | Discharge status: Home / Self Care | Payer: BC Managed Care – PPO | Source: Ambulatory Visit | Attending: Internal Medicine | Admitting: Internal Medicine

## 2020-07-26 ENCOUNTER — Other Ambulatory Visit: Payer: Self-pay | Admitting: Internal Medicine

## 2020-07-26 ENCOUNTER — Other Ambulatory Visit: Payer: Self-pay

## 2020-07-26 DIAGNOSIS — Z122 Encounter for screening for malignant neoplasm of respiratory organs: Secondary | ICD-10-CM

## 2020-07-26 DIAGNOSIS — I700 Atherosclerosis of aorta: Secondary | ICD-10-CM | POA: Diagnosis not present

## 2020-07-26 DIAGNOSIS — F17210 Nicotine dependence, cigarettes, uncomplicated: Secondary | ICD-10-CM | POA: Diagnosis not present

## 2020-07-26 DIAGNOSIS — R918 Other nonspecific abnormal finding of lung field: Secondary | ICD-10-CM | POA: Diagnosis not present

## 2020-07-26 MED ORDER — TADALAFIL 20 MG PO TABS: ORAL_TABLET | ORAL | 1 refills | Status: DC

## 2020-07-28 ENCOUNTER — Other Ambulatory Visit: Payer: Self-pay | Admitting: Internal Medicine

## 2020-07-28 DIAGNOSIS — R918 Other nonspecific abnormal finding of lung field: Secondary | ICD-10-CM

## 2020-07-28 NOTE — Progress Notes (Signed)
============================================================ ============================================================  -    Lung CT scan has a questionable new lung nodule in upper rt lung  - Requesting an appointment with Pulmonary  (Lung Specialist)   - you should hear next week about an appointment  ============================================================ ============================================================

## 2020-08-03 ENCOUNTER — Other Ambulatory Visit: Payer: Self-pay | Admitting: Internal Medicine

## 2020-08-03 DIAGNOSIS — J4520 Mild intermittent asthma, uncomplicated: Secondary | ICD-10-CM

## 2020-09-05 ENCOUNTER — Other Ambulatory Visit: Payer: Self-pay

## 2020-09-05 ENCOUNTER — Encounter: Payer: Self-pay | Admitting: Pulmonary Disease

## 2020-09-05 ENCOUNTER — Ambulatory Visit: Payer: BC Managed Care – PPO | Admitting: Pulmonary Disease

## 2020-09-05 VITALS — BP 102/62 | HR 77 | Temp 98.1°F | Ht 68.0 in | Wt 146.0 lb

## 2020-09-05 DIAGNOSIS — R911 Solitary pulmonary nodule: Secondary | ICD-10-CM

## 2020-09-05 NOTE — Patient Instructions (Addendum)
Start nice to meet you  We will get a CT scan in early September 2022.  We will follow-up in the office shortly thereafter to discuss results.  I think the spot on the CT scan represents mucus and that dilated airway.  If it is gone we will celebrate.  If it still there, we will discuss what needs to be done next.  Follow-up with Dr. Judeth Horn in 8 weeks or sooner as needed

## 2020-09-05 NOTE — Progress Notes (Signed)
@Patient  ID: , male    DOB: August 20, 1960, 60 y.o.   MRN: [id number]  Chief Complaint  Patient presents with   Consult    Patient reprorts that he wants to go over his CT results.     Referring provider: [id number], MD  HPI:   60 year old whom we are seeing in consultation for evaluation of abnormal imaging.  PCP note reviewed.  Patient doing well.  Denies significant respiratory issues.  Has longstanding history of asthma and seasonal allergies.  Notes some chest tightness, mild shortness of breath when working on pollens, dust.  Treated with as needed albuterol.  This controls symptoms very well.  Use montelukast for nasal allergy symptoms.  Again relatively well controlled.  He has been undergoing lung cancer screening scans since 02/2018.  Most recent lung cancer CT obtained about 6 months later than normal, 07/26/2020, demonstrated approximate 8 to 9 mm right upper lobe nodule on my interpretation.  Nodule appears inside dilated airway and can see it taper back proximally and airway become patent.  Similar-appearing dilated airway, essentially bronchiectasis demonstrated on CT scans 03/08/2018 and 03/09/2019.  No other suspicious nodules.  PMH: Asthma, seasonal allergies Surgical history: Reviewed, denies any Family history: Heart disease in first-degree relatives Social history: Recently quit smoking 08/2020, approximately 30 pack years, works as a 09/2020 / Pulmonary Flowsheets:   ACT:  No flowsheet data found.  MMRC: No flowsheet data found.  Epworth:  No flowsheet data found.  Tests:   FENO:  No results found for: NITRICOXIDE  PFT: No flowsheet data found.  WALK:  No flowsheet data found.  Imaging: Personally reviewed as per EMR discussion this note  Lab Results: Personally reviewed CBC    Component Value Date/Time   WBC 4.8 07/10/2020 1058   RBC 5.30 07/10/2020 1058   HGB 16.7 07/10/2020 1058   HCT 49.3 07/10/2020 1058    PLT 223 07/10/2020 1058   MCV 93.0 07/10/2020 1058   MCH 31.5 07/10/2020 1058   MCHC 33.9 07/10/2020 1058   RDW 13.3 07/10/2020 1058   LYMPHSABS 413 (L) 07/10/2020 1058   MONOABS 671 11/06/2015 1600   EOSABS 19 07/10/2020 1058   BASOSABS 10 07/10/2020 1058    BMET    Component Value Date/Time   NA 139 07/10/2020 1058   K 4.9 07/10/2020 1058   CL 105 07/10/2020 1058   CO2 26 07/10/2020 1058   GLUCOSE 122 (H) 07/10/2020 1058   BUN 15 07/10/2020 1058   CREATININE 1.03 07/10/2020 1058   CALCIUM 9.4 07/10/2020 1058   GFRNONAA 79 07/10/2020 1058   GFRAA 91 07/10/2020 1058    BNP No results found for: BNP  ProBNP No results found for: PROBNP  Specialty Problems       Pulmonary Problems   Emphysema by Chest CT scan    Allergies  Allergen Reactions   Trovan [Alatrofloxacin] Other (See Comments)    Light-headed feeling    Immunization History  Administered Date(s) Administered   DTaP 03/21/2008   PPD Test 09/27/2013, 11/06/2015, 12/23/2016, 02/03/2018, 02/23/2019, 07/10/2020   Pneumococcal-Unspecified 12/18/1989   Tdap 12/18/2017, 12/30/2017    Past Medical History:  Diagnosis Date   Allergy     Tobacco History: Social History   Tobacco Use  Smoking Status Every Day   Packs/day: 0.50   Types: Cigarettes  Smokeless Tobacco Former  Tobacco Comments   Patient has stopped temporarily. 09/05/20. Has not had one in about a week.  Ready to quit: Not Answered Counseling given: Not Answered Tobacco comments: Patient has stopped temporarily. 09/05/20. Has not had one in about a week.    Continue to not smoke  Outpatient Encounter Medications as of 09/05/2020  Medication Sig   albuterol (VENTOLIN HFA) 108 (90 Base) MCG/ACT inhaler Use  2 inhalations  15 minutes apart  every 4 hours  as needed to rescue Asthma   Cholecalciferol (VITAMIN D PO) Take 4,000 Units by mouth daily.   montelukast (SINGULAIR) 10 MG tablet Take 1 tablet Daily for Allergies & Asthma    Multiple Vitamin (MULTIVITAMIN) tablet Take 1 tablet by mouth daily.   OVER THE COUNTER MEDICATION Protein powder   tadalafil (CIALIS) 20 MG tablet Take  1/2 to 1 tablet  every 2 to 3 days  as needed for XXXX   vitamin C (ASCORBIC ACID) 500 MG tablet Take 500 mg by mouth daily.   No facility-administered encounter medications on file as of 09/05/2020.     Review of Systems  Review of Systems  Chest with exertion.  No orthopnea or PND.  Comprehensive review of systems otherwise negative. Physical Exam  BP 102/62 (BP Location: Left Arm, Patient Position: Sitting, Cuff Size: Normal)   Pulse 77   Temp 98.1 F (36.7 C) (Oral)   Ht 5\' 8"  (1.727 m)   Wt 146 lb (66.2 kg)   SpO2 97%   BMI 22.20 kg/m   Wt Readings from Last 5 Encounters:  09/05/20 146 lb (66.2 kg)  07/10/20 151 lb 6.4 oz (68.7 kg)  02/23/19 148 lb (67.1 kg)  02/03/18 150 lb 3.2 oz (68.1 kg)  12/23/16 154 lb 9.6 oz (70.1 kg)    BMI Readings from Last 5 Encounters:  09/05/20 22.20 kg/m  07/10/20 23.02 kg/m  02/23/19 22.34 kg/m  02/03/18 22.51 kg/m  12/23/16 23.16 kg/m     Physical Exam General: Well-appearing, no acute distress Eyes: EOMI, no icterus Neck: Supple, no JVD Pulmonary: Clear to auscultation bilaterally, inspiratory squeaks clear with cough, normal work of breathing Cardiovascular: Regular rhythm, no murmur Abdomen: Nondistended, bowel sounds present MSK: No synovitis, joint effusion Neuro: Normal gait, no weakness Psych: Normal mood, full affect  Assessment & Plan:   Lung nodule: Right upper lobe.  Present in area of dilated airway that is demonstrated clear in 2021 in 2020.  He describes bronchitis, increased cough, sputum production around time of the scan.  Suspect this represents impacted mucus in this dilated airway.  We will repeat CT scan at 65-month interval, 10/2020 to assess for resolution.  If still present will discuss biopsy.  Low yield PET scan given size less than 1  cm.  Tobacco abuse in remission: Recently quit smoking.  He was congratulated.  To discontinue bupropion in the coming days.  Return in about 8 weeks (around 10/31/2020).   11/02/2020, MD 09/05/2020

## 2020-09-07 ENCOUNTER — Telehealth: Payer: Self-pay | Admitting: Pulmonary Disease

## 2020-09-07 NOTE — Telephone Encounter (Signed)
Patient returned the call. He wanted to know the reasoning of why waiting until September for his repeat CT scan. I went over the office visit notes with him and explained that September would allow 3 months from his last CT scan.   He verbalized understanding.   Nothing further needed at time of call.

## 2020-09-07 NOTE — Telephone Encounter (Signed)
Called patient but he did not answer. I left a message for him to call us back.

## 2020-10-24 ENCOUNTER — Telehealth: Payer: Self-pay | Admitting: Pulmonary Disease

## 2020-10-24 ENCOUNTER — Inpatient Hospital Stay: Admission: RE | Admit: 2020-10-24 | Payer: BC Managed Care – PPO | Source: Ambulatory Visit

## 2020-10-24 ENCOUNTER — Other Ambulatory Visit: Payer: Self-pay

## 2020-10-24 ENCOUNTER — Ambulatory Visit (INDEPENDENT_AMBULATORY_CARE_PROVIDER_SITE_OTHER)
Admission: RE | Admit: 2020-10-24 | Discharge: 2020-10-24 | Disposition: A | Discharge status: Home / Self Care | Payer: BC Managed Care – PPO | Source: Ambulatory Visit | Attending: Pulmonary Disease | Admitting: Pulmonary Disease

## 2020-10-24 DIAGNOSIS — R911 Solitary pulmonary nodule: Secondary | ICD-10-CM | POA: Diagnosis not present

## 2020-10-24 DIAGNOSIS — J479 Bronchiectasis, uncomplicated: Secondary | ICD-10-CM | POA: Diagnosis not present

## 2020-10-24 DIAGNOSIS — I700 Atherosclerosis of aorta: Secondary | ICD-10-CM | POA: Diagnosis not present

## 2020-10-24 NOTE — Telephone Encounter (Signed)
Representative for [facility] dropped off CT disk for Dr. Judeth Horn. Pls regard; [phone].

## 2020-10-25 NOTE — Telephone Encounter (Signed)
Previously seen mucus impaction and dilated airway right upper lobe has resolved.  We expected this but this is still great news.  Agree with radiology that patient can return to yearly CT screening for lung cancer in 10/2021.

## 2020-10-29 ENCOUNTER — Inpatient Hospital Stay: Admission: RE | Admit: 2020-10-29 | Payer: BC Managed Care – PPO | Source: Ambulatory Visit

## 2020-11-01 ENCOUNTER — Other Ambulatory Visit: Payer: Self-pay

## 2020-11-01 ENCOUNTER — Ambulatory Visit: Payer: BC Managed Care – PPO | Admitting: Pulmonary Disease

## 2020-11-01 ENCOUNTER — Encounter: Payer: Self-pay | Admitting: Pulmonary Disease

## 2020-11-01 VITALS — BP 122/58 | HR 70 | Temp 97.6°F | Ht 68.5 in | Wt 148.0 lb

## 2020-11-01 DIAGNOSIS — F17210 Nicotine dependence, cigarettes, uncomplicated: Secondary | ICD-10-CM | POA: Diagnosis not present

## 2020-11-01 DIAGNOSIS — J4530 Mild persistent asthma, uncomplicated: Secondary | ICD-10-CM | POA: Diagnosis not present

## 2020-11-01 MED ORDER — FLUTICASONE FUROATE-VILANTEROL 100-25 MCG/INH IN AEPB: 1.0000 | INHALATION_SPRAY | Freq: Every day | RESPIRATORY_TRACT | 6 refills | Status: DC

## 2020-11-01 MED ORDER — FLUTICASONE FUROATE-VILANTEROL 100-25 MCG/INH IN AEPB
1.0000 | INHALATION_SPRAY | Freq: Every day | RESPIRATORY_TRACT | 0 refills | Status: DC
Start: 2020-11-01 — End: 2021-07-10

## 2020-11-01 NOTE — Patient Instructions (Addendum)
Nice to see you  Use Breo 1 puff daily - rinse mouth after every use  I hope this helps with wheeze, shortness of breath, particularly with season changes and reduces albuterol use  Return to clinic in 6 months

## 2020-11-01 NOTE — Addendum Note (Signed)
Addended by: Geraldo Docker on: 11/01/2020 04:00 PM   Modules accepted: Orders

## 2020-11-01 NOTE — Progress Notes (Signed)
@Patient  ID: , male    DOB: 1960-12-31, 60 y.o.   MRN: [id number]  Chief Complaint  Patient presents with   Follow-up    CT completed on 10/24/20    Referring provider: 12/24/20, MD  HPI:   60 y.o. whom we are seeing in follow up of abnormal imaging.  Patient doing well.  Reviewed repeat CT scan at length.  Dilated airway right upper lobe with previous nodule, felt to be mucus filled airway.  Repeat CT scan 10/2020, last week showed resolution of this nodule confirming mucus filled airway as well as thickened bronchioles and monitor rotation.  Overall he is doing well.  He recounts episodic cough.  Worse with seasonal changes.  Discussed thickened bronchioles on CT scan likely reflective of reactive airways or asthma.  He has used ICS/LABA inhalers in the past.  Discussed utility of this, help with symptoms, cough, reduce albuterol burden.  He is okay to move forward with this.  HPI at initial visit: Patient doing well.  Denies significant respiratory issues.  Has longstanding history of asthma and seasonal allergies.  Notes some chest tightness, mild shortness of breath when working on pollens, dust.  Treated with as needed albuterol.  This controls symptoms very well.  Use montelukast for nasal allergy symptoms.  Again relatively well controlled.  He has been undergoing lung cancer screening scans since 02/2018.  Most recent lung cancer CT obtained about 6 months later than normal, 07/26/2020, demonstrated approximate 8 to 9 mm right upper lobe nodule on my interpretation.  Nodule appears inside dilated airway and can see it taper back proximally and airway become patent.  Similar-appearing dilated airway, essentially bronchiectasis demonstrated on CT scans 03/08/2018 and 03/09/2019.  No other suspicious nodules.  PMH: Asthma, seasonal allergies Surgical history: Reviewed, denies any Family history: Heart disease in first-degree relatives Social history: Recently quit  smoking 08/2020, approximately 30 pack years, works as a 09/2020 / Pulmonary Flowsheets:   ACT:  No flowsheet data found.  MMRC: No flowsheet data found.  Epworth:  No flowsheet data found.  Tests:   FENO:  No results found for: NITRICOXIDE  PFT: No flowsheet data found.  WALK:  No flowsheet data found.  Imaging: Personally reviewed as per EMR discussion this note  Lab Results: Personally reviewed CBC    Component Value Date/Time   WBC 4.8 07/10/2020 1058   RBC 5.30 07/10/2020 1058   HGB 16.7 07/10/2020 1058   HCT 49.3 07/10/2020 1058   PLT 223 07/10/2020 1058   MCV 93.0 07/10/2020 1058   MCH 31.5 07/10/2020 1058   MCHC 33.9 07/10/2020 1058   RDW 13.3 07/10/2020 1058   LYMPHSABS 413 (L) 07/10/2020 1058   MONOABS 671 11/06/2015 1600   EOSABS 19 07/10/2020 1058   BASOSABS 10 07/10/2020 1058    BMET    Component Value Date/Time   NA 139 07/10/2020 1058   K 4.9 07/10/2020 1058   CL 105 07/10/2020 1058   CO2 26 07/10/2020 1058   GLUCOSE 122 (H) 07/10/2020 1058   BUN 15 07/10/2020 1058   CREATININE 1.03 07/10/2020 1058   CALCIUM 9.4 07/10/2020 1058   GFRNONAA 79 07/10/2020 1058   GFRAA 91 07/10/2020 1058    BNP No results found for: BNP  ProBNP No results found for: PROBNP  Specialty Problems       Pulmonary Problems   Emphysema by Chest CT scan    Allergies  Allergen Reactions   Trovan [  Alatrofloxacin] Other (See Comments)    Light-headed feeling    Immunization History  Administered Date(s) Administered   DTaP 03/21/2008   PPD Test 09/27/2013, 11/06/2015, 12/23/2016, 02/03/2018, 02/23/2019, 07/10/2020   Pneumococcal-Unspecified 12/18/1989   Tdap 12/18/2017, 12/30/2017    Past Medical History:  Diagnosis Date   Allergy     Tobacco History: Social History   Tobacco Use  Smoking Status Some Days   Packs/day: 0.25   Years: 37.00   Pack years: 9.25   Types: Cigarettes  Smokeless Tobacco Former  Tobacco  Comments   Patient is an on again off again smoker   Ready to quit: Not Answered Counseling given: Not Answered Tobacco comments: Patient is an on again off again smoker   Continue to not smoke  Outpatient Encounter Medications as of 11/01/2020  Medication Sig   albuterol (VENTOLIN HFA) 108 (90 Base) MCG/ACT inhaler Use  2 inhalations  15 minutes apart  every 4 hours  as needed to rescue Asthma   Cholecalciferol (VITAMIN D PO) Take 4,000 Units by mouth daily.   fluticasone furoate-vilanterol (BREO ELLIPTA) 100-25 MCG/INH AEPB Inhale 1 puff into the lungs daily.   montelukast (SINGULAIR) 10 MG tablet Take 1 tablet Daily for Allergies & Asthma   Multiple Vitamin (MULTIVITAMIN) tablet Take 1 tablet by mouth daily.   OVER THE COUNTER MEDICATION Protein powder   tadalafil (CIALIS) 20 MG tablet Take  1/2 to 1 tablet  every 2 to 3 days  as needed for XXXX   vitamin C (ASCORBIC ACID) 500 MG tablet Take 500 mg by mouth daily.   No facility-administered encounter medications on file as of 11/01/2020.     Review of Systems  Review of Systems  N/a Physical Exam  BP (!) 122/58 (BP Location: Right Arm, Patient Position: Sitting, Cuff Size: Normal)   Pulse 70   Temp 97.6 F (36.4 C) (Oral)   Ht 5' 8.5" (1.74 m)   Wt 148 lb (67.1 kg)   SpO2 99%   BMI 22.17 kg/m   Wt Readings from Last 5 Encounters:  11/01/20 148 lb (67.1 kg)  09/05/20 146 lb (66.2 kg)  07/10/20 151 lb 6.4 oz (68.7 kg)  02/23/19 148 lb (67.1 kg)  02/03/18 150 lb 3.2 oz (68.1 kg)    BMI Readings from Last 5 Encounters:  11/01/20 22.17 kg/m  09/05/20 22.20 kg/m  07/10/20 23.02 kg/m  02/23/19 22.34 kg/m  02/03/18 22.51 kg/m     Physical Exam General: Well-appearing, no acute distress Eyes: EOMI, no icterus Neck: Supple, no JVD Pulmonary: Clear to auscultation bilaterally, respiratory squeak on the right upper field, and expiratory wheezing bilaterally Cardiovascular: Regular rhythm, no murmur Abdomen:  Nondistended, bowel sounds present MSK: No synovitis, joint effusion Neuro: Normal gait, no weakness Psych: Normal mood, full affect  Assessment & Plan:   Lung nodule: Right upper lobe.  Present in area of dilated airway that is demonstrated clear in 2021 in 2020.  He describes bronchitis, increased cough, sputum production around time of the scan.  Suspect this represents impacted mucus in this dilated airway.  Repeat CT scan 9/22 with resolution of this, confirming mucus filled dilated airway.  Asthma: Intermittent cough, worse with change in season, mild dyspnea exertion with change of season, thickened bronchioles on CT scan.  Albuterol use helps some with cough.  Trial Breo 100 mcg dose 1 puff daily.  Tobacco abuse: Reported quitting at time of last visit.  Has intermittently smoked in the meantime.  Stressors include loss  of his father.  Discussed benefits of smoking cessation.  Recommended gradual reduction of cigarettes every few days to help with success as well as reduce withdrawal symptoms.  4-minute spent in smoking cessation.  Return in about 6 months (around 05/01/2021).   Karren Burly, MD 11/01/2020

## 2020-11-22 NOTE — Telephone Encounter (Signed)
Mychart message received by pt: [facility] (supporting Hunsucker, Lesia Sago, MD) 2 hours ago (2:07 PM)   Hey Dr Judeth Horn,   Prior to the last CT scan I was told that insurance was going to cover the cost, but I ended up getting quite a bill. Evidently it was coded as "diagnosis" rather than a "screening". Since the first scan was indeterminate, and we re-scanned to see if anomill  was still present, could it be coded as a "screening" test? It would then be fully covered. I have been unable to get anyone on the phone for the past two days. Thanks    [employer] to Dr. Judeth Horn as well as [facility] since they do the pre-certs on the CTs and also routing to [place]. Please advise.

## 2020-11-23 NOTE — Telephone Encounter (Signed)
Patient had his annual LCS CT on 07/26/20 which most insurances cover 1 LCS CT per year.  Since the 2nd CT done on 10/24/20 the patient would have to pay for at least part of the CT charged. It is just like going for mammogram and needing to have another before the next annual would be due. That is the way it has been explained to me

## 2021-07-09 ENCOUNTER — Encounter: Payer: Self-pay | Admitting: Internal Medicine

## 2021-07-09 NOTE — Progress Notes (Unsigned)
Annual  Screening/Preventative Visit  & Comprehensive Evaluation & Examination   Future Appointments  Date Time Provider Department  07/10/2021 11:00 AM Lucky Cowboy, MD [facility]  07/21/2022 11:00 AM Lucky Cowboy, MD [facility]                                                                                             This very nice 61 y.o. MWM presents for a Screening /Preventative Visit & comprehensive evaluation and management of multiple medical co-morbidities.  Patient has been followed for HTN, HLD, Prediabetes and Vitamin D Deficiency. Patient had LD Chest CT scan on 03/04/2019 showing Aortic Atherosclerosis. Patient also has hx/o Allergic Asthma. Patient reports current flare of his Asthma in the last 24-36 hours for which he's been using his Albuterol  MDI , but has been out of his Breo for > 1+ month.                  The patient has a  40+ pk year smoking hx and I discussed lung cancer screening with him & he was agreeable to undergo another  screening low dose CT scan of the chest.  He had neg LD screening Chest CT for the last 3 years. We discussed smoking cessation techniques/options. I will refer him  for a LDCT lung scan & lung cancer screening program after he recovers from his current Asthma Bronchitis flare .                                                    The patient has a  40+ pk year smoking hx and I discussed lung cancer screening with him & he was agreeable to undergo a screening low dose CT scan of the chest.  He had neg LD screening Chest CT for the last 2 years.   We discussed smoking cessation techniques/options. I will refer him  for a LDCT lung scan & lung cancer screening program                                                     Patient has been followed  since 2000  expectantly for labile HTN. Patient's BP has been controlled at home.  Today's BP s at goal - 136/80.  Patient denies any cardiac symptoms as chest pain, palpitations, shortness of  breath, dizziness or ankle swelling.                                                     Patient's hyperlipidemia is not controlled with diet. Last lipids were not at goal:  Lab Results  Component Value Date   CHOL 218 (H)  07/10/2020   HDL 85 07/10/2020   LDLCALC 121 (H) 07/10/2020   TRIG 39 07/10/2020   CHOLHDL 2.6 07/10/2020                                                     Patient has hx/o prediabetes (A1c 5.7% /2012) and patient denies reactive hypoglycemic symptoms, visual blurring, diabetic polys or paresthesias. Last A1c was not art goal goal:   Lab Results  Component Value Date   HGBA1C 6.1 (H) 07/10/2020                                                     Finally, patient has history of Vitamin D Deficiency ("37"/2008 & "27" /2012) and last vitamin D was  at goal ( 70-100):    Lab Results  Component Value Date   VD25OH 74 07/10/2020           Current Outpatient Medications on File Prior to Visit  Medication Sig   albuterol  HFA inhaler USE 2 INHALATIONS  EVERY 4 HOURS    VITAMIN D  4,000 Units  Take  daily.   Currently off   Multiple Vitamin tablet Take 1 tabletdaily.   Protein powder     vitamin C  500 MG tablet Take  daily.           Allergies  Allergen Reactions   Trovan [Alatrofloxacin] Other (See Comments)      Light-headed feeling         Health Maintenance  Topic Date Due   COVID-19 Vaccine (1) Never done   INFLUENZA VACCINE  09/17/2020   COLONOSCOPY 11/08/2023   TETANUS/TDAP  12/19/2027   Hepatitis C Screening  Completed   HIV Screening  Completed   HPV VACCINES  Aged Out          Immunization History  Administered Date(s) Administered   DTaP 03/21/2008   PPD Test 12/23/2016, 02/03/2018, 02/23/2019   Pneumococcal-23 12/18/1989   Tdap 12/18/2017      Last Colon - 11/07/2013 - Dr Deatra Ina - Recc 10 yr f/u due Sept 2025        Past Surgical History:  Procedure Laterality Date   TONSILLECTOMY        age 20   wisdom teeth                                            Family History  Problem Relation Age of Onset   Heart disease Mother     Diabetes Father     Hypertension Father     Colon cancer Neg Hx     Esophageal cancer Neg Hx     Rectal cancer Neg Hx     Stomach cancer Neg Hx     Pancreatic cancer Neg Hx     Prostate cancer Neg Hx        Social History         Socioeconomic History   Marital status: Married      Spouse name: Not on file   Number of children:  Not on file  Occupational History   Not on file  Tobacco Use   Smoking status: Current Every Day Smoker      Packs/day: 0.50      Types: Cigarettes   Smokeless tobacco: Former Systems developer  Substance and Sexual Activity   Alcohol use: Yes      Alcohol/week: 10.0 standard drinks      Types: 10 drink(s) per week      Comment: weekends   Drug use: No   Sexual activity: Not on file       ROS Constitutional: Denies fever, chills, weight loss/gain, headaches, insomnia,  night sweats or change in appetite. Does c/o fatigue. Eyes: Denies redness, blurred vision, diplopia, discharge, itchy or watery eyes.  ENT: Denies discharge, congestion, post nasal drip, epistaxis, sore throat, earache, hearing loss, dental pain, Tinnitus, Vertigo, Sinus pain or snoring.  Cardio: Denies chest pain, palpitations, irregular heartbeat, syncope, dyspnea, diaphoresis, orthopnea, PND, claudication or edema Respiratory: denies cough, dyspnea, DOE, pleurisy, hoarseness, laryngitis or wheezing.  Gastrointestinal: Denies dysphagia, heartburn, reflux, water brash, pain, cramps, nausea, vomiting, bloating, diarrhea, constipation, hematemesis, melena, hematochezia, jaundice or hemorrhoids Genitourinary: Denies dysuria, frequency, urgency, nocturia, hesitancy, discharge, hematuria or flank pain Musculoskeletal: Denies arthralgia, myalgia, stiffness, Jt. Swelling, pain, limp or strain/sprain. Denies Falls. Skin: Denies puritis, rash, hives, warts, acne, eczema or change in skin  lesion Neuro: No weakness, tremor, incoordination, spasms, paresthesia or pain Psychiatric: Denies confusion, memory loss or sensory loss. Denies Depression. Endocrine: Denies change in weight, skin, hair change, nocturia, and paresthesia, diabetic polys, visual blurring or hyper / hypo glycemic episodes.  Heme/Lymph: No excessive bleeding, bruising or enlarged lymph nodes.     Physical Exam   BP 136/80   Pulse 100   Temp 97.9 F (36.6 C)   Resp 16   Ht 5\' 8"  (1.727 m)   Wt 141 lb 12.8 oz (64.3 kg)   SpO2 94%   BMI 21.56 kg/m   General Appearance: Well nourished and well groomed and in no apparent distress.   Eyes: PERRLA, EOMs, conjunctiva no swelling or erythema, normal fundi and vessels. Sinuses: No frontal/maxillary tenderness ENT/Mouth: EACs patent / TMs  nl. Nares clear without erythema, swelling, mucoid exudates. Oral hygiene is good. No erythema, swelling, or exudate. Tongue normal, non-obstructing. Tonsils not swollen or erythematous. Hearing normal.  Neck: Supple, thyroid not palpable. No bruits, nodes or JVD. Respiratory: Respiratory effort normal.  BS equal and clear bilateral without rales, rhonci, wheezing or stridor. Cardio: Heart sounds are normal with regular rate and rhythm and no murmurs, rubs or gallops. Peripheral pulses are normal and equal bilaterally without edema. No aortic or femoral bruits. Chest: symmetric with normal excursions and percussion.  Abdomen: Soft, with Nl bowel sounds. Nontender, no guarding, rebound, hernias, masses, or organomegaly.  Lymphatics: Non tender without lymphadenopathy.  Musculoskeletal: Full ROM all peripheral extremities, joint stability, 5/5 strength, and normal gait. Skin: Warm and dry without rashes, lesions, cyanosis, clubbing or  ecchymosis.  Neuro: Cranial nerves intact, reflexes equal bilaterally. Normal muscle tone, no cerebellar symptoms. Sensation intact.  Pysch: Alert and oriented X 3 with normal affect, insight and  judgment appropriate.    Assessment and Plan   1. Annual Preventative/Screening Exam    2. Labile hypertension  - EKG 12-Lead - [place], RETROPERITNL ABD,  LTD - Urinalysis, Routine w reflex microscopic - Microalbumin / creatinine urine ratio - CBC with Differential/Platelet - COMPLETE METABOLIC PANEL WITH GFR - Magnesium - TSH  3. Hyperlipidemia, mixed  -  EKG 12-Lead - [place], RETROPERITNL ABD,  LTD - Lipid panel  4. Abnormal glucose  - EKG 12-Lead - [place], RETROPERITNL ABD,  LTD - Hemoglobin A1c - Insulin, random  5. Vitamin D deficiency  - VITAMIN D 25 Hydroxy   6. Aortic atherosclerosis (HCC)  - EKG 12-Lead - [place], RETROPERITNL ABD,  LTD - Lipid panel  7. Prediabetes  - Hemoglobin A1c - Insulin, random  8. Vitamin B12 deficiency  - Vitamin B12  9. Testosterone deficiency  - Testosterone  10. Intermittent asthma without complication  - Sx & Rx for Telegy Ellipta QD , Rx for Decadron 4 mg pulse/taper & restart Montelukast  qd  11. Encounter for screening for lung cancer  - CT CHEST LUNG CANCER SCREENING LOW DOSE WO CONTRAST; Future  12. Screening for colorectal cancer  - POC Hemoccult Bld/Stl   13. Screening examination for pulmonary tuberculosis  - TB Skin Test  14. Prostate cancer screening  - PSA  15. Screening for heart disease  - EKG 12-Lead  16. FHx: heart disease  - EKG 12-Lead - [place], RETROPERITNL ABD,  LTD  17. Smoker  - EKG 12-Lead - [place], RETROPERITNL ABD,  LTD - CT CHEST LUNG CANCER SCREENING LOW DOSE WO CONTRAST; Future  18. Screening for AAA (aortic abdominal aneurysm)  - [place], RETROPERITNL ABD,  LTD  19. Fatigue  - Iron, Total/Total Iron Binding Cap - Vitamin B12 - CBC with Differential/Platelet - TSH  20. Medication management  - Urinalysis, Routine w reflex microscopic - Microalbumin / creatinine urine ratio - Testosterone - CBC with Differential/Platelet - COMPLETE METABOLIC PANEL WITH GFR - Magnesium - Lipid  panel - TSH - Hemoglobin A1c - Insulin, random - VITAMIN D 25 Hydroxy                                                                            Patient was counseled in prudent diet, weight control to achieve/maintain BMI less than 25, BP monitoring, regular exercise and medications as discussed.  Discussed med effects and SE's. Routine screening labs and tests as requested with regular follow-up as recommended. Over 40 minutes of exam, counseling, chart review and high complex critical decision making was performed     Kirtland Bouchard, MD

## 2021-07-09 NOTE — Patient Instructions (Signed)

## 2021-07-10 ENCOUNTER — Encounter: Payer: Self-pay | Admitting: Internal Medicine

## 2021-07-10 ENCOUNTER — Ambulatory Visit (INDEPENDENT_AMBULATORY_CARE_PROVIDER_SITE_OTHER): Payer: BC Managed Care – PPO | Admitting: Internal Medicine

## 2021-07-10 VITALS — BP 136/80 | HR 100 | Temp 97.9°F | Resp 16 | Ht 68.0 in | Wt 141.8 lb

## 2021-07-10 DIAGNOSIS — Z111 Encounter for screening for respiratory tuberculosis: Secondary | ICD-10-CM

## 2021-07-10 DIAGNOSIS — R0989 Other specified symptoms and signs involving the circulatory and respiratory systems: Secondary | ICD-10-CM | POA: Diagnosis not present

## 2021-07-10 DIAGNOSIS — R350 Frequency of micturition: Secondary | ICD-10-CM

## 2021-07-10 DIAGNOSIS — R7303 Prediabetes: Secondary | ICD-10-CM

## 2021-07-10 DIAGNOSIS — Z1329 Encounter for screening for other suspected endocrine disorder: Secondary | ICD-10-CM

## 2021-07-10 DIAGNOSIS — Z136 Encounter for screening for cardiovascular disorders: Secondary | ICD-10-CM | POA: Diagnosis not present

## 2021-07-10 DIAGNOSIS — Z13220 Encounter for screening for lipoid disorders: Secondary | ICD-10-CM | POA: Diagnosis not present

## 2021-07-10 DIAGNOSIS — I700 Atherosclerosis of aorta: Secondary | ICD-10-CM | POA: Diagnosis not present

## 2021-07-10 DIAGNOSIS — F17200 Nicotine dependence, unspecified, uncomplicated: Secondary | ICD-10-CM

## 2021-07-10 DIAGNOSIS — Z131 Encounter for screening for diabetes mellitus: Secondary | ICD-10-CM

## 2021-07-10 DIAGNOSIS — Z1389 Encounter for screening for other disorder: Secondary | ICD-10-CM

## 2021-07-10 DIAGNOSIS — Z0000 Encounter for general adult medical examination without abnormal findings: Secondary | ICD-10-CM | POA: Diagnosis not present

## 2021-07-10 DIAGNOSIS — E349 Endocrine disorder, unspecified: Secondary | ICD-10-CM

## 2021-07-10 DIAGNOSIS — Z0001 Encounter for general adult medical examination with abnormal findings: Secondary | ICD-10-CM

## 2021-07-10 DIAGNOSIS — Z125 Encounter for screening for malignant neoplasm of prostate: Secondary | ICD-10-CM

## 2021-07-10 DIAGNOSIS — R5383 Other fatigue: Secondary | ICD-10-CM

## 2021-07-10 DIAGNOSIS — Z130 Encounter for screening for diseases of the blood and blood-forming organs and certain disorders involving the immune mechanism: Secondary | ICD-10-CM | POA: Diagnosis not present

## 2021-07-10 DIAGNOSIS — E782 Mixed hyperlipidemia: Secondary | ICD-10-CM

## 2021-07-10 DIAGNOSIS — J4520 Mild intermittent asthma, uncomplicated: Secondary | ICD-10-CM

## 2021-07-10 DIAGNOSIS — R7309 Other abnormal glucose: Secondary | ICD-10-CM

## 2021-07-10 DIAGNOSIS — N401 Enlarged prostate with lower urinary tract symptoms: Secondary | ICD-10-CM

## 2021-07-10 DIAGNOSIS — Z79899 Other long term (current) drug therapy: Secondary | ICD-10-CM | POA: Diagnosis not present

## 2021-07-10 DIAGNOSIS — Z8249 Family history of ischemic heart disease and other diseases of the circulatory system: Secondary | ICD-10-CM

## 2021-07-10 DIAGNOSIS — E559 Vitamin D deficiency, unspecified: Secondary | ICD-10-CM | POA: Diagnosis not present

## 2021-07-10 DIAGNOSIS — Z1211 Encounter for screening for malignant neoplasm of colon: Secondary | ICD-10-CM

## 2021-07-10 DIAGNOSIS — E538 Deficiency of other specified B group vitamins: Secondary | ICD-10-CM

## 2021-07-10 DIAGNOSIS — Z122 Encounter for screening for malignant neoplasm of respiratory organs: Secondary | ICD-10-CM

## 2021-07-10 MED ORDER — DEXAMETHASONE 4 MG PO TABS: ORAL_TABLET | ORAL | 1 refills | Status: DC

## 2021-07-10 MED ORDER — TRELEGY ELLIPTA 200-62.5-25 MCG/ACT IN AEPB: INHALATION_SPRAY | RESPIRATORY_TRACT | 11 refills | Status: DC

## 2021-07-10 MED ORDER — FLUTICASONE FUROATE-VILANTEROL 200-25 MCG/ACT IN AEPB: INHALATION_SPRAY | RESPIRATORY_TRACT | 11 refills | Status: DC

## 2021-07-10 MED ORDER — MONTELUKAST SODIUM 10 MG PO TABS
ORAL_TABLET | ORAL | 3 refills | Status: DC
Start: 2021-07-10 — End: 2022-09-12

## 2021-07-10 NOTE — Addendum Note (Signed)
Addended by: Lucky Cowboy on: 07/10/2021 03:59 PM   Modules accepted: Orders

## 2021-07-10 NOTE — Addendum Note (Signed)
Addended by: Lucky Cowboy on: 07/10/2021 02:36 PM   Modules accepted: Orders

## 2021-07-11 LAB — LIPID PANEL
Cholesterol: 206 mg/dL — ABNORMAL HIGH (ref <200)
HDL: 89 mg/dL (ref >=40)
LDL Cholesterol (Calc): 103 mg/dL (calc) — ABNORMAL HIGH
Non-HDL Cholesterol (Calc): 117 mg/dL (calc) (ref <130)
Total CHOL/HDL Ratio: 2.3 (calc) (ref <5.0)
Triglycerides: 54 mg/dL (ref <150)

## 2021-07-11 LAB — MAGNESIUM: Magnesium: 2.2 mg/dL (ref 1.5–2.5)

## 2021-07-11 LAB — CBC WITH DIFFERENTIAL/PLATELET
Absolute Monocytes: 372 cells/uL (ref 200–950)
Basophils Absolute: 10 cells/uL (ref 0–200)
Basophils Relative: 0.1 %
Eosinophils Absolute: 0 cells/uL — ABNORMAL LOW (ref 15–500)
Eosinophils Relative: 0 %
HCT: 48.3 % (ref 38.5–50.0)
Hemoglobin: 16.2 g/dL (ref 13.2–17.1)
Lymphs Abs: 363 cells/uL — ABNORMAL LOW (ref 850–3900)
MCH: 30.5 pg (ref 27.0–33.0)
MCHC: 33.5 g/dL (ref 32.0–36.0)
MCV: 90.8 fL (ref 80.0–100.0)
MPV: 11.4 fL (ref 7.5–12.5)
Monocytes Relative: 3.8 %
Neutro Abs: 9055 cells/uL — ABNORMAL HIGH (ref 1500–7800)
Neutrophils Relative %: 92.4 %
Platelets: 262 10*3/uL (ref 140–400)
RBC: 5.32 10*6/uL (ref 4.20–5.80)
RDW: 13.3 % (ref 11.0–15.0)
Total Lymphocyte: 3.7 %
WBC: 9.8 10*3/uL (ref 3.8–10.8)

## 2021-07-11 LAB — COMPLETE METABOLIC PANEL WITH GFR
AG Ratio: 1.8 (calc) (ref 1.0–2.5)
ALT: 20 U/L (ref 9–46)
AST: 26 U/L (ref 10–35)
Albumin: 4.6 g/dL (ref 3.6–5.1)
Alkaline phosphatase (APISO): 46 U/L (ref 35–144)
BUN: 15 mg/dL (ref 7–25)
CO2: 23 mmol/L (ref 20–32)
Calcium: 9.7 mg/dL (ref 8.6–10.3)
Chloride: 106 mmol/L (ref 98–110)
Creat: 1.04 mg/dL (ref 0.70–1.35)
Globulin: 2.5 g/dL (calc) (ref 1.9–3.7)
Glucose, Bld: 118 mg/dL — ABNORMAL HIGH (ref 65–99)
Potassium: 4.5 mmol/L (ref 3.5–5.3)
Sodium: 142 mmol/L (ref 135–146)
Total Bilirubin: 0.4 mg/dL (ref 0.2–1.2)
Total Protein: 7.1 g/dL (ref 6.1–8.1)
eGFR: 82 mL/min/{1.73_m2} (ref >=60)

## 2021-07-11 LAB — URINALYSIS, ROUTINE W REFLEX MICROSCOPIC
Bilirubin Urine: NEGATIVE
Glucose, UA: NEGATIVE
Hgb urine dipstick: NEGATIVE
Ketones, ur: NEGATIVE
Leukocytes,Ua: NEGATIVE
Nitrite: NEGATIVE
Protein, ur: NEGATIVE
Specific Gravity, Urine: 1.01 (ref 1.001–1.035)
pH: 6 (ref 5.0–8.0)

## 2021-07-11 LAB — TESTOSTERONE: Testosterone: 648 ng/dL (ref 250–827)

## 2021-07-11 LAB — MICROALBUMIN / CREATININE URINE RATIO
Creatinine, Urine: 54 mg/dL (ref 20–320)
Microalb Creat Ratio: 6 mcg/mg creat (ref <30)
Microalb, Ur: 0.3 mg/dL

## 2021-07-11 LAB — TSH: TSH: 1.07 mIU/L (ref 0.40–4.50)

## 2021-07-11 LAB — VITAMIN D 25 HYDROXY (VIT D DEFICIENCY, FRACTURES): Vit D, 25-Hydroxy: 79 ng/mL (ref 30–100)

## 2021-07-11 LAB — IRON, TOTAL/TOTAL IRON BINDING CAP
%SAT: 18 % (calc) — ABNORMAL LOW (ref 20–48)
Iron: 58 ug/dL (ref 50–180)
TIBC: 328 mcg/dL (calc) (ref 250–425)

## 2021-07-11 LAB — INSULIN, RANDOM: Insulin: 8.8 u[IU]/mL

## 2021-07-11 LAB — HEMOGLOBIN A1C
Hgb A1c MFr Bld: 5.9 % of total Hgb — ABNORMAL HIGH (ref <5.7)
Mean Plasma Glucose: 123 mg/dL
eAG (mmol/L): 6.8 mmol/L

## 2021-07-11 LAB — PSA: PSA: 0.39 ng/mL (ref <=4.00)

## 2021-07-11 LAB — VITAMIN B12: Vitamin B-12: 252 pg/mL (ref 200–1100)

## 2021-07-11 NOTE — Progress Notes (Signed)
<><><><><><><><><><><><><><><><><><><><><><><><><><><><><><><><><> <><><><><><><><><><><><><><><><><><><><><><><><><><><><><><><><><> - Test results slightly outside the reference range are not unusual. If there is anything important, I will review this with you,  otherwise it is considered normal test values.  If you have further questions,  please do not hesitate to contact me at the office or via My Chart.  <><><><><><><><><><><><><><><><><><><><><><><><><><><><><><><><><> <><><><><><><><><><><><><><><><><><><><><><><><><><><><><><><><><>  -  Iron level is a little low    - Recommend take an OTC Iron pill &    - Eat more Veggies with Iron as Carrots, Beets,   all leafy green veggies as Spinach, Collards,  Turnip - Mustard or Mixed Greens,   Kale, Asparagus, Broccoli, Brussel Sprouts,   Green Beans / peas, Soybeans, Lentils, Sweet Potatoes <><><><><><><><><><><><><><><><><><><><><><><><><><><><><><><><><>  -  Total  Chol =   206    - Elevated             (  Ideal  or  Goal is less than 180  !  )  & LDL Chol = 103 - Also slightly elevated -  Bad / Dangerous LDL  Chol =   103             (  Ideal  or  Goal is less than 70  !  )    - So recommend a stricter low chol diet  diet  - Cholesterol is too high - Recommend low cholesterol diet   - Cholesterol only comes from animal sources  - ie. meat, dairy, egg yolks  - Eat all the vegetables you want.  - Avoid Meat, Avoid Meat,  Avoid Meat - especially Red Meat - Beef AND Pork .  - Avoid cheese & dairy - milk & ice cream.     - Cheese is the most concentrated form of trans-fats which  is the worst thing to clog up our arteries.   - Veggie cheese is OK which can be found in the fresh  produce section at [employer] or [employer] or [employer] <><><><><><><><><><><><><><><><><><><><><><><><><><><><><><><><><>  -  A1c = 5.9%   -  Blood sugar and A1c are  STILL elevated in the borderline and  early or pre-diabetes range  which has the same   300% increased risk for heart attack, stroke, cancer and                                       alzheimer- type vascular dementia as full blown diabetes.   But the good news is that diet, exercise with weight loss can                                                                          cure the early diabetes at this point. <><><><><><><><><><><><><><><><><><><><><><><><><><><><><><><><><>  -  Vitamin B12 =   252     is STILL   Very Low                    (Ideal or Goal Vit B12 is between 450 - 1,100)   Low Vit B12 may be associated with Anemia , Fatigue, Impotence,  Peripheral Neuropathy, Dementia, "Brain Fog", & Depression  - Recommend take a sub-lingual form of  Vitamin B12 tablet   1,000 to 5,000 mcg tab that you dissolve under your tongue /Daily   - Can get Baron Sane - best price at [employer] or on [employer] <><><><><><><><><><><><><><><><><><><><><><><><><><><><><><><><><>  -  PSA - Low - Great - Low risk for Prostate cancer  <><><><><><><><><><><><><><><><><><><><><><><><><><><><><><><><><>  -  Vitamin D = 79  - Great  !   - Vitamin D goal is between 70-100.   - It is very important as a natural anti-inflammatory and helping the  immune system protect against viral infections, like the Covid-19   helping hair, skin, and nails, as well as reducing stroke and heart attack risk.   - It helps your bones and helps with mood.  - It also decreases numerous cancer risks so please  take it as directed.   - Low Vit D is associated with a 200-300% higher risk for CANCER   and 200-300% higher risk for HEART   ATTACK  &  STROKE.    - It is also associated with higher death rate at younger ages,   autoimmune diseases like Rheumatoid arthritis, Lupus, Multiple Sclerosis.     - Also many other serious conditions, like depression, Alzheimer's  Dementia, infertility, muscle aches, fatigue, fibromyalgia   - just to name a  few. <><><><><><><><><><><><><><><><><><><><><><><><><><><><><><><><><> <><><><><><><><><><><><><><><><><><><><><><><><><><><><><><><><><>

## 2021-07-16 ENCOUNTER — Encounter: Payer: Self-pay | Admitting: Internal Medicine

## 2021-07-16 ENCOUNTER — Ambulatory Visit: Payer: BC Managed Care – PPO | Admitting: Adult Health

## 2021-07-16 ENCOUNTER — Other Ambulatory Visit: Payer: Self-pay | Admitting: Internal Medicine

## 2021-07-16 VITALS — BP 130/93 | HR 90 | Temp 96.9°F | Wt 138.0 lb

## 2021-07-16 DIAGNOSIS — R0989 Other specified symptoms and signs involving the circulatory and respiratory systems: Secondary | ICD-10-CM | POA: Diagnosis not present

## 2021-07-16 DIAGNOSIS — R0609 Other forms of dyspnea: Secondary | ICD-10-CM

## 2021-07-16 DIAGNOSIS — J439 Emphysema, unspecified: Secondary | ICD-10-CM

## 2021-07-16 DIAGNOSIS — J45901 Unspecified asthma with (acute) exacerbation: Secondary | ICD-10-CM

## 2021-07-16 DIAGNOSIS — J441 Chronic obstructive pulmonary disease with (acute) exacerbation: Secondary | ICD-10-CM | POA: Diagnosis not present

## 2021-07-16 DIAGNOSIS — J4520 Mild intermittent asthma, uncomplicated: Secondary | ICD-10-CM

## 2021-07-16 MED ORDER — BREZTRI AEROSPHERE 160-9-4.8 MCG/ACT IN AERO: 2.0000 | INHALATION_SPRAY | Freq: Two times a day (BID) | RESPIRATORY_TRACT | 0 refills | Status: DC

## 2021-07-16 MED ORDER — IPRATROPIUM-ALBUTEROL 0.5-2.5 (3) MG/3ML IN SOLN: 3.0000 mL | Freq: Once | RESPIRATORY_TRACT | Status: DC

## 2021-07-16 MED ORDER — GUAIFENESIN ER 600 MG PO TB12: 600.0000 mg | ORAL_TABLET | Freq: Two times a day (BID) | ORAL | 2 refills | Status: DC | PRN

## 2021-07-16 MED ORDER — PREDNISONE 20 MG PO TABS: ORAL_TABLET | ORAL | 0 refills | Status: DC

## 2021-07-16 NOTE — Patient Instructions (Addendum)
INFORMATION ABOUT YOUR XRAY [facility] If not improving, you can walk into [address] for an Insurance account manager. They will have the order and take you back without an appointment, usually within 15-20 min. I should get the result back that day or the next morning. This order is good for a year.   Recommend to quit smoking -   PUSH water and start taking mucinex twice daily (can take [phone] mg twice daily)  INSTEAD of BREO/TRELLEGY - will try breztri sample - 2 puffs (wait 5-10 min between each puff) twice daily - total 4 puffs/day  Breathe out, take med in with deep inhale and hold 15 sec if you can before breathing back out  Be sure to brush teeth/gargle after using this inhaler      Budesonide; Glycopyrrolate; Formoterol Metered Dose Inhaler (MDI) What is this medication? BUDESONIDE; GLYCOPYRROLATE; FORMOTEROL (byoo DES oh nide; glye koe PYE roe late; for [facility] te rol) treats chronic obstructive pulmonary disease (COPD). It works by opening the airways of the lungs, making it easier to breathe. It is a combination of an inhaled steroid, an anticholinergic, and a bronchodilator. It is often called a controller inhaler. Do not use it to treat a sudden COPD flare-up. This medicine may be used for other purposes; ask your health care provider or pharmacist if you have questions. COMMON BRAND NAME(S): BREZTRI What should I tell my care team before I take this medication? They need to know if you have any of these conditions: Bladder problems or trouble passing urine Bone problems Diabetes Eye disease, vision problems Heart disease High blood pressure History of irregular heartbeat Immune system problems Infection Kidney disease Pheochromocytoma Prostate disease Seizures Thyroid disease An unusual or allergic reaction to budesonide, formoterol, glycopyrrolate, medications, foods, dyes, or preservatives Pregnant or trying to get pregnant Breast-feeding How should I use  this medication? This medication is inhaled through the mouth. Take it as directed on the prescription label at the same time every day. Shake well before each use. Rinse your mouth with water after use. Make sure not to swallow the water. Do not take your medication more often than directed. Keep taking it unless your care team tells you to stop. Make sure that you are using your inhaler correctly. This medication comes with INSTRUCTIONS FOR USE. Ask your pharmacist for directions on how to use this medication. Read the information carefully. Talk to your pharmacist or care team if you have questions. Talk to your care team about the use of this medication in children. It is not approved for use in children. Overdosage: If you think you have taken too much of this medicine contact a poison control center or emergency room at once. NOTE: This medicine is only for you. Do not share this medicine with others. What if I miss a dose? If you miss a dose, use it as soon as you can. If it is almost time for your next dose, use only that dose. Do not use double or extra doses. What may interact with this medication? Do not take the medication with any of the following: Cisapride Dofetilide Dronedarone MAOIs like Marplan, Nardil, and Parnate Other medications that contain long-acting beta-2 agonists (LABAs) like arformoterol, formoterol, indacaterol, olodaterol, salmeterol, vilanterol Pimozide Procarbazine Thioridazine This medication may also interact with the following: Antihistamines for allergy, cough, and cold Atropine Certain antibiotics like clarithromycin, telithromycin Certain antivirals for HIV or hepatitis Certain heart medications like atenolol, metoprolol Certain medications for bladder problems like  oxybutynin, tolterodine Certain medications for blood pressure, heart disease, irregular heartbeat Certain medications for depression, anxiety, or mental health conditions Certain  medications for fungal infections like ketoconazole, itraconazole Certain medications for Parkinson's disease like benztropine, trihexyphenidyl Certain medications for stomach problems like dicyclomine, hyoscyamine Certain medications for travel sickness like scopolamine Diuretics Grapefruit juice Mifepristone Other inhaled medications that contain anticholinergics such as aclidinium, ipratropium, tiotropium, umeclidinium Other medications that prolong the QT interval (an abnormal heart rhythm) Some vaccines Steroid medications like prednisone or cortisone Stimulant medications for attention disorders, weight loss, or to stay awake Theophylline This list may not describe all possible interactions. Give your health care provider a list of all the medicines, herbs, non-prescription drugs, or dietary supplements you use. Also tell them if you smoke, drink alcohol, or use illegal drugs. Some items may interact with your medicine. What should I watch for while using this medication? Visit your care team for regular checks on your progress. Tell your care team if your symptoms do not start to get better or if they get worse. NEVER use this medication for an acute asthma or COPD attack. You should use your short-acting rescue inhalers for this purpose. If your symptoms get worse or if you need your short-acting inhalers more often, call your care team right away. This medication may increase your risk of getting an infection. Tell your care team if you are around anyone with measles or chickenpox, or if you develop sores or blisters that do not heal properly. Do not get this medication in your eyes. It can cause irritation, pain, or blurred vision. What side effects may I notice from receiving this medication? Side effects that you should report to your care team as soon as possible: Allergic reactions or angioedema--skin rash, itching or hives, swelling of the face, eyes, lips, tongue, arms, or legs,  trouble swallowing or breathing Heart rhythm changes--fast or irregular heartbeat, dizziness, feeling faint or lightheaded, chest pain, trouble breathing Increase in blood pressure Low adrenal gland function--nausea, vomiting, loss of appetite, unusual weakness or fatigue, dizziness Muscle pain or cramps Sudden eye pain or change in vision such as blurry vision, seeing halos around lights, vision loss Thrush--white patches in the mouth Trouble passing urine Wheezing or trouble breathing that is worse after use Side effects that usually do not require medical attention (report to your care team if they continue or are bothersome): Change in taste Constipation Cough Dry mouth Headache Hoarseness Runny or stuffy nose Sore throat Tremors or shaking Trouble sleeping This list may not describe all possible side effects. Call your doctor for medical advice about side effects. You may report side effects to FDA at 1-800-FDA-1088. Where should I keep my medication? Keep out of the reach of children and pets. Store in a dry place at room temperature between 15 and 30 degrees C (59 and 86 degrees F). Protect from heat. Do not freeze. Do not use or store near heat or flame, as the canister may burst. Throw away the inhaler 3 months after you open the foil pouch (for the 120-inhalation canister), or 3 weeks after you open the foil pouch (for the 28-inhalation canister), or when the dose indicator reaches zero "0", whichever comes first. To get rid of medication that are no longer needed or have expired: Take the medication to a medication take-back program. Check with your pharmacy or law enforcement to find a location. If you cannot return the medication, ask your pharmacist or care team how to get rid  of this medication safely. NOTE: This sheet is a summary. It may not cover all possible information. If you have questions about this medicine, talk to your doctor, pharmacist, or health care  provider.  2023 Elsevier/Gold Standard (2021-01-18 00:00:00)

## 2021-07-16 NOTE — Progress Notes (Signed)
Assessment and Plan:  Torre was seen today for uri.  Diagnoses and all orders for this visit:  Asthma exacerbation with COPD (chronic obstructive pulmonary disease) (HCC) Abnormal lung sounds Exertional dyspnea Wheezing/rhonchi/dyspnea improved sig after duoneb Most consistent with COPD flare in asthmatic CXR to r/o early stage pneumonia if not improving Declines home duoneb at this time Will do steroid taper, try switching to breztri - advised close follow up in 1-3 days if not improving or any worsening, CXR/abx may need to be added STOP SMOKING - expresses understanding Recommend ED evaluation of rapid progression overnight -     ipratropium-albuterol (DUONEB) 0.5-2.5 (3) MG/3ML nebulizer solution 3 mL -     DG Chest 2 View; Future -     Budeson-Glycopyrrol-Formoterol (BREZTRI AEROSPHERE) 160-9-4.8 MCG/ACT AERO; Inhale 2 puffs into the lungs in the morning and at bedtime. Rinse mouth/gargle after each use. Wait 10 min between puffs. Take INSTEAD of breo. -     predniSONE (DELTASONE) 20 MG tablet; Take 3 tablet for 3 days, then take 2 tabs for 3 days, then take 1 tab daily. Take with food in the morning. -     guaiFENesin (MUCINEX) 600 MG 12 hr tablet; Take 1 tablet (600 mg total) by mouth 2 (two) times daily as needed.  Further disposition pending results of imaging.  Discussed med's effects and SE's.   Over 30 minutes of exam, counseling, chart review, and critical decision making was performed.   Future Appointments  Date Time Provider Department Center  07/21/2022 11:00 AM Lucky Cowboy, MD [facility] None    ------------------------------------------------------------------------------------------------------------------   HPI BP (!) 130/93   Pulse 90   Temp (!) 96.9 F (36.1 C)   Wt 138 lb (62.6 kg)   SpO2 91%   BMI 20.98 kg/m  61 y.o.male smoker with hx of asthma and COPD by imaging presents for evaluation of wheezing and exertional dyspnea.   He reports sx  began 8 days ago with URI sx, coughing, congestion, sore throat, settled into upper chest, now having productive cough (mostly clear, worse in AM), wheezing and dyspnea with exertion, worse in last 2-3 days.  Denies fever/chills, fatigue, CP/aching or pressure.   At baseline he takes breo, singulaire; at CPE 1 week ago, Dr. Oneta Rack gave him trelegy sample and also dexamethasone taper, has 2-3 days left, but patient states this doesn't seem to be helping as much as previous prednisone tapers have. He has been using albuterol every 5-6 hours with perceived benefit.   He does have guaifenacin but admits not taking regularly   He denies notable allergy sx  He is a smoker, currently down to 5-6 per day,     Past Medical History:  Diagnosis Date   Allergy      Allergies  Allergen Reactions   Trovan [Alatrofloxacin] Other (See Comments)    Light-headed feeling    Current Outpatient Medications on File Prior to Visit  Medication Sig   albuterol (VENTOLIN HFA) 108 (90 Base) MCG/ACT inhaler Use  2 inhalations  15 minutes apart  every 4 hours  as needed to rescue Asthma   Cholecalciferol (VITAMIN D PO) Take 4,000 Units by mouth daily.   dexamethasone (DECADRON) 4 MG tablet Take 1 tab 3 x day - 3 days, then 2 x day - 3 days, then 1 tab daily   fluticasone furoate-vilanterol (BREO ELLIPTA) 200-25 MCG/ACT AEPB Use  1  Inhalation Daily for Asthma   montelukast (SINGULAIR) 10 MG tablet Take 1 tablet daily  for Allergies & Asthma   Multiple Vitamin (MULTIVITAMIN) tablet Take 1 tablet by mouth daily.   OVER THE COUNTER MEDICATION Protein powder   tadalafil (CIALIS) 20 MG tablet Take  1/2 to 1 tablet  every 2 to 3 days  as needed for XXXX   vitamin C (ASCORBIC ACID) 500 MG tablet Take 500 mg by mouth daily.   No current facility-administered medications on file prior to visit.    ROS: all negative except above.   Physical Exam:  BP (!) 130/93   Pulse 90   Temp (!) 96.9 F (36.1 C)   Wt 138  lb (62.6 kg)   SpO2 91%   BMI 20.98 kg/m   General Appearance: Well nourished, in no apparent distress. Eyes: PERRLA, conjunctiva no swelling or erythema Sinuses: No Frontal/maxillary tenderness ENT/Mouth: Ext aud canals clear, TMs without erythema, bulging. No erythema, swelling, or exudate on post pharynx.  Tonsils not swollen or erythematous. Hearing normal.  Neck: Supple  Respiratory: Respiratory effort normal, BS present throughout with rales, rhonchi, and corse bronchial sounds throughout - rales/ronchi resolved after breathing treatment, breath sounds present all fields.  Cardio: RRR with no MRGs. Brisk peripheral pulses without edema.  Abdomen: Soft, + BS.  Non tender Lymphatics: Non tender without lymphadenopathy.  Musculoskeletal: normal gait.  Skin: Warm, dry without rashes, lesions, ecchymosis.  Neuro: Normal muscle tone Psych: Awake and oriented X 3, normal affect, Insight and Judgment appropriate.     Dan Maker, NP 12:05 PM [facility]

## 2021-07-17 ENCOUNTER — Ambulatory Visit
Admission: RE | Admit: 2021-07-17 | Discharge: 2021-07-17 | Disposition: A | Discharge status: Home / Self Care | Payer: BC Managed Care – PPO | Source: Ambulatory Visit | Attending: Adult Health | Admitting: Adult Health

## 2021-07-17 ENCOUNTER — Telehealth: Payer: Self-pay | Admitting: Adult Health

## 2021-07-17 ENCOUNTER — Encounter: Payer: Self-pay | Admitting: Adult Health

## 2021-07-17 DIAGNOSIS — R059 Cough, unspecified: Secondary | ICD-10-CM | POA: Diagnosis not present

## 2021-07-17 DIAGNOSIS — R0609 Other forms of dyspnea: Secondary | ICD-10-CM

## 2021-07-17 NOTE — Telephone Encounter (Signed)
Pt called back after conversation with Morrie Sheldon, refused to [employer] message her because he said he had just gotten off the phone with her. I explained that she had already gone into a room with another pt and I was not able to remove her for non urgent request and again asked him to [place] message her as that is the purpose and convenience of the [employer].he did not want to. He says he has a question in regards to "not being able to push too hard" at work, but that is all he would tell me.

## 2021-07-18 NOTE — Telephone Encounter (Signed)
Left message on voice mail  to call back

## 2021-07-23 ENCOUNTER — Other Ambulatory Visit: Payer: Self-pay | Admitting: Adult Health

## 2021-07-23 ENCOUNTER — Encounter: Payer: Self-pay | Admitting: Adult Health

## 2021-07-23 DIAGNOSIS — J4520 Mild intermittent asthma, uncomplicated: Secondary | ICD-10-CM

## 2021-07-23 MED ORDER — PREDNISONE 20 MG PO TABS: ORAL_TABLET | ORAL | 0 refills | Status: DC

## 2021-07-23 MED ORDER — NYSTATIN 100000 UNIT/ML MT SUSP: OROMUCOSAL | 0 refills | Status: DC

## 2021-09-10 ENCOUNTER — Other Ambulatory Visit: Payer: BC Managed Care – PPO

## 2021-10-22 ENCOUNTER — Other Ambulatory Visit: Payer: BC Managed Care – PPO

## 2021-10-29 ENCOUNTER — Inpatient Hospital Stay: Admission: RE | Admit: 2021-10-29 | Payer: BC Managed Care – PPO | Source: Ambulatory Visit

## 2021-11-14 ENCOUNTER — Other Ambulatory Visit: Payer: Self-pay | Admitting: Internal Medicine

## 2021-11-14 DIAGNOSIS — J4520 Mild intermittent asthma, uncomplicated: Secondary | ICD-10-CM

## 2021-11-19 ENCOUNTER — Ambulatory Visit
Admission: RE | Admit: 2021-11-19 | Discharge: 2021-11-19 | Disposition: A | Discharge status: Home / Self Care | Payer: BC Managed Care – PPO | Source: Ambulatory Visit | Attending: Internal Medicine | Admitting: Internal Medicine

## 2021-11-19 DIAGNOSIS — F17200 Nicotine dependence, unspecified, uncomplicated: Secondary | ICD-10-CM

## 2021-11-19 DIAGNOSIS — Z122 Encounter for screening for malignant neoplasm of respiratory organs: Secondary | ICD-10-CM

## 2021-11-19 DIAGNOSIS — F17210 Nicotine dependence, cigarettes, uncomplicated: Secondary | ICD-10-CM | POA: Diagnosis not present

## 2021-11-21 NOTE — Progress Notes (Signed)
<><><><><><><><><><><><><><><><><><><><><><><><><><><><><><><><><> <><><><><><><><><><><><><><><><><><><><><><><><><><><><><><><><><>  -   Chest CT is "OK" - No sign of cancer . Radiologist recommends continuing Annual   Follow-ups.   <><><><><><><><><><><><><><><><><><><><><><><><><><><><><><><><><> <><><><><><><><><><><><><><><><><><><><><><><><><><><><><><><><><>

## 2022-01-07 ENCOUNTER — Ambulatory Visit: Payer: BC Managed Care – PPO | Admitting: Nurse Practitioner

## 2022-01-14 ENCOUNTER — Ambulatory Visit: Payer: BC Managed Care – PPO | Admitting: Nurse Practitioner

## 2022-01-14 ENCOUNTER — Encounter: Payer: Self-pay | Admitting: Nurse Practitioner

## 2022-01-14 VITALS — BP 100/62 | HR 65 | Temp 97.7°F | Ht 68.0 in | Wt 150.4 lb

## 2022-01-14 DIAGNOSIS — J439 Emphysema, unspecified: Secondary | ICD-10-CM | POA: Diagnosis not present

## 2022-01-14 DIAGNOSIS — R0989 Other specified symptoms and signs involving the circulatory and respiratory systems: Secondary | ICD-10-CM

## 2022-01-14 DIAGNOSIS — F17200 Nicotine dependence, unspecified, uncomplicated: Secondary | ICD-10-CM

## 2022-01-14 DIAGNOSIS — E782 Mixed hyperlipidemia: Secondary | ICD-10-CM

## 2022-01-14 DIAGNOSIS — J4520 Mild intermittent asthma, uncomplicated: Secondary | ICD-10-CM

## 2022-01-14 DIAGNOSIS — I700 Atherosclerosis of aorta: Secondary | ICD-10-CM

## 2022-01-14 DIAGNOSIS — Z79899 Other long term (current) drug therapy: Secondary | ICD-10-CM

## 2022-01-14 NOTE — Patient Instructions (Signed)

## 2022-01-14 NOTE — Progress Notes (Signed)
FOLLOW UP  Assessment and Plan:   Biagio was seen today for follow-up.  Diagnoses and all orders for this visit:  Intermittent asthma without complication, unspecified asthma severity Encourage daily use of Breo and Singulair Encourage to quit smoking  Pulmonary emphysema, unspecified emphysema type (HCC) Encourage daily use of Breo and Singulair Encourage to quit smoking Monitor symptoms  Labile hypertension - continue DASH diet, exercise and monitor at home. Call if greater than 130/80.   Hyperlipidemia, mixed Continue diet and exercise  Aortic atherosclerosis (HCC) Control BP, weight, blood sugars and cholesterol  Smoker Strongly encouraged to d/c smoking. Has cut down from 1/2-1 ppd to 5-6 cigarettes a day  Medication management Continued Declines labs today as he just had done fasting at work.  Will send results as soon as he has them    Continue diet and meds as discussed. Further disposition pending results of labs. Discussed med's effects and SE's.   Over 30 minutes of exam, counseling, chart review, and critical decision making was performed.   Future Appointments  Date Time Provider Department Center  07/21/2022 11:00 AM Lucky Cowboy, MD [facility] None    ----------------------------------------------------------------------------------------------------------------------  HPI 61 y.o. male  presents for 3 month follow up on  Patient Active Problem List   Diagnosis Date Noted   Aortic atherosclerosis (HCC) 03/07/2019   Emphysema by Chest CT scan 03/07/2019   Elevated BP, screening 11/10/2014   Elevated cholesterol, screening 11/10/2014   Prediabetes 11/10/2014   Medication management 11/10/2014   Asthma 09/27/2013   Vitamin D deficiency 09/27/2013     BMI is Body mass index is 22.87 kg/m., he has been working on diet and exercise. Wt Readings from Last 3 Encounters:  01/14/22 150 lb 6.4 oz (68.2 kg)  07/16/21 138 lb (62.6 kg)  07/10/21  141 lb 12.8 oz (64.3 kg)   He is on Breo but not taking everyday, uses as needed.  Not using his singulair daily. He denies difficulty breathing or coughing currently. Trying to cut back on smoking .  Was smoking 1/2-1 ppd and is now down to 5-6 a day.   His blood pressure has been controlled at home without medication, today their BP is BP: 100/62  BP Readings from Last 3 Encounters:  01/14/22 100/62  07/16/21 (!) 130/93  07/10/21 136/80  He does workout. He denies chest pain, shortness of breath, dizziness.   He is not on cholesterol medication . His cholesterol is not at goal.  Red meat 1 time a week, not limiting dairy or eggs The cholesterol last visit was:   Lab Results  Component Value Date   CHOL 206 (H) 07/10/2021   HDL 89 07/10/2021   LDLCALC 103 (H) 07/10/2021   TRIG 54 07/10/2021   CHOLHDL 2.3 07/10/2021    He has been working on diet and exercise for abnormal glucose . Last A1C in the office was:  Lab Results  Component Value Date   HGBA1C 5.9 (H) 07/10/2021   Patient is on Vitamin D supplement.   Lab Results  Component Value Date   VD25OH 79 07/10/2021        Current Medications:  Current Outpatient Medications on File Prior to Visit  Medication Sig   albuterol (VENTOLIN HFA) 108 (90 Base) MCG/ACT inhaler INHALE TWO PUFFS 15 MINUTES APART BY MOUTH EVERY 4 HOURS AS NEEDED TO RESCUE ASTHMA   Cholecalciferol (VITAMIN D PO) Take 4,000 Units by mouth daily.   fluticasone furoate-vilanterol (BREO ELLIPTA) 200-25 MCG/ACT AEPB Use  1  Inhalation Daily for Asthma   montelukast (SINGULAIR) 10 MG tablet Take 1 tablet daily for Allergies & Asthma (Patient taking differently: Take 1 tablet daily for Allergies & Asthma PRN)   Multiple Vitamin (MULTIVITAMIN) tablet Take 1 tablet by mouth daily.   Multiple Vitamins-Minerals (ZINC PO) Take by mouth.   tadalafil (CIALIS) 20 MG tablet Take  1/2 to 1 tablet  every 2 to 3 days  as needed for XXXX   vitamin C (ASCORBIC ACID) 500  MG tablet Take 500 mg by mouth daily.   Budeson-Glycopyrrol-Formoterol (BREZTRI AEROSPHERE) 160-9-4.8 MCG/ACT AERO Inhale 2 puffs into the lungs in the morning and at bedtime. Rinse mouth/gargle after each use. Wait 10 min between puffs. Take INSTEAD of breo. (Patient not taking: Reported on 01/14/2022)   guaiFENesin (MUCINEX) 600 MG 12 hr tablet Take 1 tablet (600 mg total) by mouth 2 (two) times daily as needed. (Patient not taking: Reported on 01/14/2022)   nystatin (MYCOSTATIN) 100000 UNIT/ML suspension 5 ml four times a day, retain in mouth as long as possible Passenger transport manager and Spit).  Use for 48 hours after symptoms resolve. (Patient not taking: Reported on 01/14/2022)   OVER THE COUNTER MEDICATION Protein powder (Patient not taking: Reported on 01/14/2022)   predniSONE (DELTASONE) 20 MG tablet Take 3 tablet for 3 days, then take 2 tabs for 3 days, then take 1 tab daily. Take with food in the morning. (Patient not taking: Reported on 01/14/2022)   Current Facility-Administered Medications on File Prior to Visit  Medication   ipratropium-albuterol (DUONEB) 0.5-2.5 (3) MG/3ML nebulizer solution 3 mL     Allergies:  Allergies  Allergen Reactions   Trovan [Alatrofloxacin] Other (See Comments)    Light-headed feeling     Medical History:  Past Medical History:  Diagnosis Date   Allergy    Family history- Reviewed and unchanged Social history- Reviewed and unchanged   Review of Systems:  Review of Systems  Constitutional:  Negative for chills, fever and weight loss.  HENT:  Negative for congestion and hearing loss.   Eyes:  Negative for blurred vision and double vision.  Respiratory:  Negative for cough and shortness of breath.        Occ wheezing  Cardiovascular:  Negative for chest pain, palpitations, orthopnea and leg swelling.  Gastrointestinal:  Negative for abdominal pain, constipation, diarrhea, heartburn, nausea and vomiting.  Musculoskeletal:  Negative for falls, joint  pain and myalgias.  Skin:  Negative for rash.  Neurological:  Negative for dizziness, tingling, tremors, loss of consciousness and headaches.  Psychiatric/Behavioral:  Negative for depression, memory loss and suicidal ideas.       Physical Exam: BP 100/62   Pulse 65   Temp 97.7 F (36.5 C)   Ht 5\' 8"  (1.727 m)   Wt 150 lb 6.4 oz (68.2 kg)   SpO2 96%   BMI 22.87 kg/m  Wt Readings from Last 3 Encounters:  01/14/22 150 lb 6.4 oz (68.2 kg)  07/16/21 138 lb (62.6 kg)  07/10/21 141 lb 12.8 oz (64.3 kg)   General Appearance: Well nourished, in no apparent distress. Eyes: PERRLA, EOMs, conjunctiva no swelling or erythema Sinuses: No Frontal/maxillary tenderness ENT/Mouth: Ext aud canals clear, TMs without erythema, bulging. No erythema, swelling, or exudate on post pharynx.  Tonsils not swollen or erythematous. Hearing normal.  Neck: Supple, thyroid normal.  Respiratory: Respiratory effort normal, Expiratory wheezes throughout lungs bilaterally Cardio: RRR with no MRGs. Brisk peripheral pulses without edema.  Abdomen: Soft, +  BS.  Non tender, no guarding, rebound, hernias, masses. Lymphatics: Non tender without lymphadenopathy.  Musculoskeletal: Full ROM, 5/5 strength, Normal gait Skin: Warm, dry without rashes, lesions, ecchymosis.  Neuro: Cranial nerves intact. No cerebellar symptoms.  Psych: Awake and oriented X 3, normal affect, Insight and Judgment appropriate.    Raynelle Dick, NP 4:13 PM [facility]

## 2022-07-13 ENCOUNTER — Other Ambulatory Visit: Payer: Self-pay | Admitting: Internal Medicine

## 2022-07-13 DIAGNOSIS — J4520 Mild intermittent asthma, uncomplicated: Secondary | ICD-10-CM

## 2022-07-13 MED ORDER — BREO ELLIPTA 200-25 MCG/ACT IN AEPB: INHALATION_SPRAY | RESPIRATORY_TRACT | 3 refills | Status: AC

## 2022-07-13 MED ORDER — ALBUTEROL SULFATE HFA 108 (90 BASE) MCG/ACT IN AERS: INHALATION_SPRAY | RESPIRATORY_TRACT | 3 refills | Status: AC

## 2022-07-21 ENCOUNTER — Encounter: Payer: BC Managed Care – PPO | Admitting: Internal Medicine

## 2022-08-12 ENCOUNTER — Ambulatory Visit (INDEPENDENT_AMBULATORY_CARE_PROVIDER_SITE_OTHER): Payer: BC Managed Care – PPO | Admitting: Internal Medicine

## 2022-08-12 ENCOUNTER — Encounter: Payer: Self-pay | Admitting: Internal Medicine

## 2022-08-12 VITALS — BP 130/72 | HR 76 | Temp 97.9°F | Resp 16 | Ht 68.0 in | Wt 150.0 lb

## 2022-08-12 DIAGNOSIS — Z125 Encounter for screening for malignant neoplasm of prostate: Secondary | ICD-10-CM

## 2022-08-12 DIAGNOSIS — Z8249 Family history of ischemic heart disease and other diseases of the circulatory system: Secondary | ICD-10-CM

## 2022-08-12 DIAGNOSIS — Z130 Encounter for screening for diseases of the blood and blood-forming organs and certain disorders involving the immune mechanism: Secondary | ICD-10-CM | POA: Diagnosis not present

## 2022-08-12 DIAGNOSIS — Z131 Encounter for screening for diabetes mellitus: Secondary | ICD-10-CM

## 2022-08-12 DIAGNOSIS — Z13220 Encounter for screening for lipoid disorders: Secondary | ICD-10-CM | POA: Diagnosis not present

## 2022-08-12 DIAGNOSIS — I700 Atherosclerosis of aorta: Secondary | ICD-10-CM

## 2022-08-12 DIAGNOSIS — N401 Enlarged prostate with lower urinary tract symptoms: Secondary | ICD-10-CM

## 2022-08-12 DIAGNOSIS — Z0000 Encounter for general adult medical examination without abnormal findings: Secondary | ICD-10-CM | POA: Diagnosis not present

## 2022-08-12 DIAGNOSIS — Z1389 Encounter for screening for other disorder: Secondary | ICD-10-CM

## 2022-08-12 DIAGNOSIS — Z1329 Encounter for screening for other suspected endocrine disorder: Secondary | ICD-10-CM | POA: Diagnosis not present

## 2022-08-12 DIAGNOSIS — E349 Endocrine disorder, unspecified: Secondary | ICD-10-CM

## 2022-08-12 DIAGNOSIS — E559 Vitamin D deficiency, unspecified: Secondary | ICD-10-CM

## 2022-08-12 DIAGNOSIS — R5383 Other fatigue: Secondary | ICD-10-CM

## 2022-08-12 DIAGNOSIS — R350 Frequency of micturition: Secondary | ICD-10-CM | POA: Diagnosis not present

## 2022-08-12 DIAGNOSIS — Z111 Encounter for screening for respiratory tuberculosis: Secondary | ICD-10-CM

## 2022-08-12 DIAGNOSIS — Z136 Encounter for screening for cardiovascular disorders: Secondary | ICD-10-CM | POA: Diagnosis not present

## 2022-08-12 DIAGNOSIS — Z1212 Encounter for screening for malignant neoplasm of rectum: Secondary | ICD-10-CM

## 2022-08-12 DIAGNOSIS — E538 Deficiency of other specified B group vitamins: Secondary | ICD-10-CM

## 2022-08-12 DIAGNOSIS — Z0001 Encounter for general adult medical examination with abnormal findings: Secondary | ICD-10-CM

## 2022-08-12 DIAGNOSIS — E782 Mixed hyperlipidemia: Secondary | ICD-10-CM

## 2022-08-12 DIAGNOSIS — Z122 Encounter for screening for malignant neoplasm of respiratory organs: Secondary | ICD-10-CM

## 2022-08-12 DIAGNOSIS — F17200 Nicotine dependence, unspecified, uncomplicated: Secondary | ICD-10-CM

## 2022-08-12 DIAGNOSIS — Z79899 Other long term (current) drug therapy: Secondary | ICD-10-CM

## 2022-08-12 DIAGNOSIS — R0989 Other specified symptoms and signs involving the circulatory and respiratory systems: Secondary | ICD-10-CM | POA: Diagnosis not present

## 2022-08-12 DIAGNOSIS — R7309 Other abnormal glucose: Secondary | ICD-10-CM

## 2022-08-12 LAB — CBC WITH DIFFERENTIAL/PLATELET
Absolute Monocytes: 526 cells/uL (ref 200–950)
Basophils Relative: 0.4 %
Eosinophils Absolute: 190 cells/uL (ref 15–500)
HCT: 43.5 % (ref 38.5–50.0)
MPV: 11.4 fL (ref 7.5–12.5)
Neutrophils Relative %: 62.2 %
RBC: 4.78 10*6/uL (ref 4.20–5.80)
RDW: 13.5 % (ref 11.0–15.0)

## 2022-08-12 NOTE — Patient Instructions (Signed)

## 2022-08-12 NOTE — Progress Notes (Signed)
Annual  Screening/Preventative Visit  & Comprehensive Evaluation & Examination    Future Appointments  Date Time Provider Department  08/12/2022                               cpe  3:00 PM Lucky Cowboy, MD [facility]  08/19/2023                                  cpe  3:00 PM Lucky Cowboy, MD [facility]                                                                                            This very nice 62 y.o. MWM presents for a Screening /Preventative Visit & comprehensive evaluation and management of multiple medical co-morbidities.  Patient has been followed for HTN, HLD, Prediabetes and Vitamin D Deficiency. Patient had LD Chest CT scan in 2021 showing Aortic Atherosclerosis. Patient also has hx/o Allergic Asthma. Patient reports current flare of his Asthma in the last 24-36 hours for which he's been using his Albuterol  MDI , but has been out of his Breo for > 1+ month.                    The patient has a  40+ pk year smoking hx and I discussed lung cancer screening with him & he was agreeable to undergo another  screening low dose CT scan of the chest.  He had neg LD screening Chest CT for the last 3 years. We discussed smoking cessation techniques/options. I will refer him  for a LDCT lung scan & lung cancer screening program after he recovers from his current Asthma Bronchitis flare .                                                     The patient has a  40+ pk year smoking hx and I discussed lung cancer screening with him & he was agreeable to undergo a screening low dose CT scan of the chest.  He had neg LD screening Chest CT for the last 2 years.   We discussed smoking cessation techniques/options. I will refer him  for a LDCT lung scan & lung cancer screening program                                                      Patient has been followed  since 2000  expectantly for labile HTN. Patient's BP has been controlled at home.  Today's BP s at goal - 136/80.   Patient denies any cardiac symptoms as chest pain, palpitations, shortness of breath, dizziness or ankle swelling.  Patient's hyperlipidemia is not controlled with diet. Last lipids were not at goal:  Lab Results  Component Value Date   CHOL 218 (H) 07/10/2020   HDL 85 07/10/2020   LDLCALC 121 (H) 07/10/2020   TRIG 39 07/10/2020   CHOLHDL 2.6 07/10/2020                                                     Patient has hx/o prediabetes (A1c 5.7% /2012) and patient denies reactive hypoglycemic symptoms, visual blurring, diabetic polys or paresthesias. Last A1c was not art goal goal:   Lab Results  Component Value Date   HGBA1C 6.1 (H) 07/10/2020                                                      Finally, patient has history of Vitamin D Deficiency ("37"/2008 & "27" /2012) and last vitamin D was  at goal ( 70-100):    Lab Results  Component Value Date   VD25OH 74 07/10/2020            Current Outpatient Medications  Medication Instructions   albuterol  HFA  inhaler Use  2 Inhalations  every 4 hours  as needed    VITAMIN C  500 mg Daily   VITAMIN D   4,000 Units Daily   BREO ELLIPTA  200-25 Use 1 Inhalation Daily for Asthma   montelukast   10 MG tablet Take 1 tablet daily   Multiple Vitamin   1 tablet  Daily   Multiple Vitamins-Mineral Oral   tadalafil (CIALIS) 20 MG tablet Take  1/2 to 1 tablet  every 2 to 3 days  as needed          Allergies  Allergen Reactions   Trovan [Alatrofloxacin] Other (See Comments)      Light-headed feeling         Health Maintenance  Topic Date Due   COVID-19 Vaccine (1) Never done   INFLUENZA VACCINE  09/17/2020   COLONOSCOPY 11/08/2023   TETANUS/TDAP  12/19/2027   Hepatitis C Screening  Completed   HIV Screening  Completed   HPV VACCINES  Aged Out          Immunization History  Administered Date(s) Administered   DTaP 03/21/2008   PPD Test 12/23/2016, 02/03/2018,  02/23/2019   Pneumococcal-23 12/18/1989   Tdap 12/18/2017      Last Colon - 11/07/2013 - Dr Arlyce Dice - Recc 10 yr f/u due Sept 2025        Past Surgical History:  Procedure Laterality Date   TONSILLECTOMY        age 86   wisdom teeth                                           Family History  Problem Relation Age of Onset   Heart disease Mother     Diabetes Father     Hypertension Father     Colon cancer Neg Hx     Esophageal cancer Neg Hx     Rectal cancer Neg  Hx     Stomach cancer Neg Hx     Pancreatic cancer Neg Hx     Prostate cancer Neg Hx        Social History         Socioeconomic History   Marital status: Married      Spouse name: Not on file   Number of children: Not on file  Occupational History   Not on file  Tobacco Use   Smoking status: Current Every Day Smoker      Packs/day: 0.50      Types: Cigarettes   Smokeless tobacco: Former Neurosurgeon  Substance and Sexual Activity   Alcohol use: Yes      Alcohol/week: 10.0 standard drinks      Types: 10 drink(s) per week      Comment: weekends   Drug use: No   Sexual activity: Not on file       ROS Constitutional: Denies fever, chills, weight loss/gain, headaches, insomnia,  night sweats or change in appetite. Does c/o fatigue. Eyes: Denies redness, blurred vision, diplopia, discharge, itchy or watery eyes.  ENT: Denies discharge, congestion, post nasal drip, epistaxis, sore throat, earache, hearing loss, dental pain, Tinnitus, Vertigo, Sinus pain or snoring.  Cardio: Denies chest pain, palpitations, irregular heartbeat, syncope, dyspnea, diaphoresis, orthopnea, PND, claudication or edema Respiratory: denies cough, dyspnea, DOE, pleurisy, hoarseness, laryngitis or wheezing.  Gastrointestinal: Denies dysphagia, heartburn, reflux, water brash, pain, cramps, nausea, vomiting, bloating, diarrhea, constipation, hematemesis, melena, hematochezia, jaundice or hemorrhoids Genitourinary: Denies dysuria, frequency,  urgency, nocturia, hesitancy, discharge, hematuria or flank pain Musculoskeletal: Denies arthralgia, myalgia, stiffness, Jt. Swelling, pain, limp or strain/sprain. Denies Falls. Skin: Denies puritis, rash, hives, warts, acne, eczema or change in skin lesion Neuro: No weakness, tremor, incoordination, spasms, paresthesia or pain Psychiatric: Denies confusion, memory loss or sensory loss. Denies Depression. Endocrine: Denies change in weight, skin, hair change, nocturia, and paresthesia, diabetic polys, visual blurring or hyper / hypo glycemic episodes.  Heme/Lymph: No excessive bleeding, bruising or enlarged lymph nodes.     Physical Exam   BP 130/72   Pulse 76   Temp 97.9 F (36.6 C)   Resp 16   Ht 5\' 8"  (1.727 m)   Wt 150 lb (68 kg)   SpO2 98%   BMI 22.81 kg/m   General Appearance: Well nourished and well groomed and in no apparent distress.   Eyes: PERRLA, EOMs, conjunctiva no swelling or erythema, normal fundi and vessels. Sinuses: No frontal/maxillary tenderness ENT/Mouth: EACs patent / TMs  nl. Nares clear without erythema, swelling, mucoid exudates. Oral hygiene is good. No erythema, swelling, or exudate. Tongue normal, non-obstructing. Tonsils not swollen or erythematous. Hearing normal.  Neck: Supple, thyroid not palpable. No bruits, nodes or JVD. Respiratory: Respiratory effort normal.  BS equal and clear bilateral without rales, rhonci, wheezing or stridor. Cardio: Heart sounds are normal with regular rate and rhythm and no murmurs, rubs or gallops. Peripheral pulses are normal and equal bilaterally without edema. No aortic or femoral bruits. Chest: symmetric with normal excursions and percussion.  Abdomen: Soft, with Nl bowel sounds. Nontender, no guarding, rebound, hernias, masses, or organomegaly.  Lymphatics: Non tender without lymphadenopathy.  Musculoskeletal: Full ROM all peripheral extremities, joint stability, 5/5 strength, and normal gait. Skin: Warm and dry  without rashes, lesions, cyanosis, clubbing or  ecchymosis.  Neuro: Cranial nerves intact, reflexes equal bilaterally. Normal muscle tone, no cerebellar symptoms. Sensation intact.  Pysch: Alert and oriented X 3 with normal affect, insight  and judgment appropriate.    Assessment and Plan   1. Annual Preventative/Screening Exam    2. Labile hypertension  - EKG 12-Lead - [place], RETROPERITNL ABD,  LTD - Urinalysis, Routine w reflex microscopic - Microalbumin / creatinine urine ratio - CBC with Differential/Platelet - COMPLETE METABOLIC PANEL WITH GFR - Magnesium - TSH  3. Hyperlipidemia, mixed  - EKG 12-Lead - [place], RETROPERITNL ABD,  LTD - Lipid panel  4. Abnormal glucose  - EKG 12-Lead - [place], RETROPERITNL ABD,  LTD - Hemoglobin A1c - Insulin, random  5. Vitamin D deficiency  - VITAMIN D 25 Hydroxy   6. Aortic atherosclerosis (HCC)  - EKG 12-Lead - [place], RETROPERITNL ABD,  LTD - Lipid panel  7. Prediabetes  - Hemoglobin A1c - Insulin, random  8. Vitamin B12 deficiency  - Vitamin B12  9. Testosterone deficiency  - Testosterone  10. Intermittent asthma without complication  - Sx & Rx for Telegy Ellipta QD , Rx for Decadron 4 mg pulse/taper & restart Montelukast  qd  11. Encounter for screening for lung cancer  - CT CHEST LUNG CANCER SCREENING LOW DOSE WO CONTRAST; Future  12. Screening for colorectal cancer  - POC Hemoccult Bld/Stl   13. Screening examination for pulmonary tuberculosis  - TB Skin Test  14. Prostate cancer screening  - PSA  15. Screening for heart disease  - EKG 12-Lead  16. FHx: heart disease  - EKG 12-Lead - [place], RETROPERITNL ABD,  LTD  17. Smoker  - EKG 12-Lead - [place], RETROPERITNL ABD,  LTD - CT CHEST LUNG CANCER SCREENING LOW DOSE WO CONTRAST; Future  18. Screening for AAA (aortic abdominal aneurysm)  - [place], RETROPERITNL ABD,  LTD  19. Fatigue  - Iron, Total/Total Iron Binding Cap - Vitamin B12 - CBC with  Differential/Platelet - TSH  20. Medication management  - Urinalysis, Routine w reflex microscopic - Microalbumin / creatinine urine ratio - Testosterone - CBC with Differential/Platelet - COMPLETE METABOLIC PANEL WITH GFR - Magnesium - Lipid panel - TSH - Hemoglobin A1c - Insulin, random - VITAMIN D 25 Hydroxy                                                                            Patient was counseled in prudent diet, weight control to achieve/maintain BMI less than 25, BP monitoring, regular exercise and medications as discussed.  Discussed med effects and SE's. Routine screening labs and tests as requested with regular follow-up as recommended. Over 40 minutes of exam, counseling, chart review and high complex critical decision making was performed     Marinus Maw, MD

## 2022-08-13 ENCOUNTER — Encounter: Payer: Self-pay | Admitting: Internal Medicine

## 2022-08-13 ENCOUNTER — Other Ambulatory Visit: Payer: Self-pay | Admitting: Internal Medicine

## 2022-08-13 LAB — MICROALBUMIN / CREATININE URINE RATIO
Creatinine, Urine: 54 mg/dL (ref 20–320)
Microalb, Ur: <0.2 mg/dL

## 2022-08-13 LAB — COMPLETE METABOLIC PANEL WITH GFR
AG Ratio: 2 (calc) (ref 1.0–2.5)
ALT: 15 U/L (ref 9–46)
AST: 18 U/L (ref 10–35)
Albumin: 4.3 g/dL (ref 3.6–5.1)
Alkaline phosphatase (APISO): 39 U/L (ref 35–144)
BUN: 16 mg/dL (ref 7–25)
CO2: 24 mmol/L (ref 20–32)
Calcium: 9.3 mg/dL (ref 8.6–10.3)
Chloride: 108 mmol/L (ref 98–110)
Creat: 1.07 mg/dL (ref 0.70–1.35)
Globulin: 2.1 g/dL (calc) (ref 1.9–3.7)
Glucose, Bld: 93 mg/dL (ref 65–99)
Potassium: 4.3 mmol/L (ref 3.5–5.3)
Sodium: 139 mmol/L (ref 135–146)
Total Bilirubin: 0.3 mg/dL (ref 0.2–1.2)
Total Protein: 6.4 g/dL (ref 6.1–8.1)
eGFR: 78 mL/min/{1.73_m2} (ref >=60)

## 2022-08-13 LAB — HEMOGLOBIN A1C
Hgb A1c MFr Bld: 6 % of total Hgb — ABNORMAL HIGH (ref <5.7)
Mean Plasma Glucose: 126 mg/dL
eAG (mmol/L): 7 mmol/L

## 2022-08-13 LAB — LIPID PANEL
Cholesterol: 178 mg/dL (ref <200)
HDL: 74 mg/dL (ref >=40)
LDL Cholesterol (Calc): 86 mg/dL (calc)
Non-HDL Cholesterol (Calc): 104 mg/dL (calc) (ref <130)
Total CHOL/HDL Ratio: 2.4 (calc) (ref <5.0)
Triglycerides: 86 mg/dL (ref <150)

## 2022-08-13 LAB — URINALYSIS, ROUTINE W REFLEX MICROSCOPIC
Bilirubin Urine: NEGATIVE
Glucose, UA: NEGATIVE
Hgb urine dipstick: NEGATIVE
Ketones, ur: NEGATIVE
Leukocytes,Ua: NEGATIVE
Nitrite: NEGATIVE
Protein, ur: NEGATIVE
Specific Gravity, Urine: 1.01 (ref 1.001–1.035)
pH: 6.5 (ref 5.0–8.0)

## 2022-08-13 LAB — CBC WITH DIFFERENTIAL/PLATELET
Basophils Absolute: 22 cells/uL (ref 0–200)
Eosinophils Relative: 3.4 %
Hemoglobin: 14.9 g/dL (ref 13.2–17.1)
Lymphs Abs: 1378 cells/uL (ref 850–3900)
MCH: 31.2 pg (ref 27.0–33.0)
MCHC: 34.3 g/dL (ref 32.0–36.0)
MCV: 91 fL (ref 80.0–100.0)
Monocytes Relative: 9.4 %
Neutro Abs: 3483 cells/uL (ref 1500–7800)
Platelets: 217 10*3/uL (ref 140–400)
Total Lymphocyte: 24.6 %
WBC: 5.6 10*3/uL (ref 3.8–10.8)

## 2022-08-13 LAB — IRON, TOTAL/TOTAL IRON BINDING CAP
%SAT: 27 % (calc) (ref 20–48)
Iron: 81 ug/dL (ref 50–180)
TIBC: 297 mcg/dL (calc) (ref 250–425)

## 2022-08-13 LAB — INSULIN, RANDOM: Insulin: 6.1 u[IU]/mL

## 2022-08-13 LAB — TSH: TSH: 1.42 mIU/L (ref 0.40–4.50)

## 2022-08-13 LAB — PSA: PSA: 0.95 ng/mL (ref <=4.00)

## 2022-08-13 LAB — VITAMIN D 25 HYDROXY (VIT D DEFICIENCY, FRACTURES): Vit D, 25-Hydroxy: 80 ng/mL (ref 30–100)

## 2022-08-13 LAB — VITAMIN B12: Vitamin B-12: 225 pg/mL (ref 200–1100)

## 2022-08-13 LAB — TESTOSTERONE: Testosterone: 655 ng/dL (ref 250–827)

## 2022-08-13 LAB — MAGNESIUM: Magnesium: 2.2 mg/dL (ref 1.5–2.5)

## 2022-08-13 NOTE — Progress Notes (Signed)
^<^<^<^<^<^<^<^<^<^<^<^<^<^<^<^<^<^<^<^<^<^<^<^<^<^<^<^<^<^<^<^<^<^<^<^<^ ^>^>^>^>^>^>^>^>^>^>^>>^>^>^>^>^>^>^>^>^>^>^>^>^>^>^>^>^>^>^>^>^>^>^>^>^>  -  Test results slightly outside the reference range are not unusual. If there is anything important, I will review this with you,  otherwise it is considered normal test values.  If you have further questions,  please do not hesitate to contact me at the office or via My Chart.   ^<^<^<^<^<^<^<^<^<^<^<^<^<^<^<^<^<^<^<^<^<^<^<^<^<^<^<^<^<^<^<^<^<^<^<^<^ ^>^>^>^>^>^>^>^>^>^>^>^>^>^>^>^>^>^>^>^>^>^>^>^>^>^>^>^>^>^>^>^>^>^>^>^>^  -  A1c = 6.0% - is borderline elevated 12 week average blood sugar  ( goal is less than 5.7)  - So   - Avoid Sweets, Candy & White Stuff   - White Rice, White Potatoes, White Flour  - Breads &  Pasta  ^>^>^>^>^>^>^>^>^>^>^>^>^>^>^>^>^>^>^>^>^>^>^>^>^>^>^>^>^>^>^>^>^>^>^>^>^  - Iron Levels Normal & OK   ^>^>^>^>^>^>^>^>^>^>^>^>^>^>^>^>^>^>^>^>^>^>^>^>^>^>^>^>^>^>^>^>^>^>^>^>^ ^>^>^>^>^>^>^>^>^>^>^>^>^>^>^>^>^>^>^>^>^>^>^>^>^>^>^>^>^>^>^>^>^>^>^>^>^   -  Vitamin B12 = 225   is STILL VERY  VERY LOW !                   (Ideal or Goal Vit B12 is between 450 - 1,100)   Low Vit B12 may be associated with Anemia , Fatigue,   Peripheral Neuropathy, Dementia, "Brain Fog", & Depression   - Recommend take a sub-lingual form of Vitamin B12 tablet   1,000 to 5,000 mcg tab that you dissolve under your tongue /Daily   - Can get Lavonia Dana - best price at [employer] or on [employer]  ^>^>^>^>^>^>^>^>^>^>^>^>^>^>^>^>^>^>^>^>^>^>^>^>^>^>^>^>^>^>^>^>^>^>^>^>^ ^>^>^>^>^>^>^>^>^>^>^>^>^>^>^>^>^>^>^>^>^>^>^>^>^>^>^>^>^>^>^>^>^>^>^>^>^  -  PSA - Very Low - No Prostate Cancer - Great !   ^>^>^>^>^>^>^>^>^>^>^>^>^>^>^>^>^>^>^>^>^>^>^>^>^>^>^>^>^>^>^>^>^>^>^>^>^  - Testosterone level - Normal   ^>^>^>^>^>^>^>^>^>^>^>^>^>^>^>^>^>^>^>^>^>^>^>^>^>^>^>^>^>^>^>^>^>^>^>^>^  - Chol = 178 - Much Much Better  !    Excellent   - Very  low risk for Heart Attack  / Stroke  ^>^>^>^>^>^>^>^>^>^>^>^>^>^>^>^>^>^>^>^>^>^>^>^>^>^>^>^>^>^>^>^>^>^>^>^>^ ^>^>^>^>^>^>^>^>^>^>^>^>^>^>^>^>^>^>^>^>^>^>^>^>^>^>^>^>^>^>^>^>^>^>^>^>^  -  Vitamin D = 80 - Great !             Keep dose same   ^>^>^>^>^>^>^>^>^>^>^>^>^>^>^>^>^>^>^>^>^>^>^>^>^>^>^>^>^>^>^>^>^>^>^>^>^ ^>^>^>^>^>^>^>^>^>^>^>^>^>^>^>^>^>^>^>^>^>^>^>^>^>^>^>^>^>^>^>^>^>^>^>^>^

## 2022-09-12 ENCOUNTER — Other Ambulatory Visit: Payer: Self-pay | Admitting: Internal Medicine

## 2022-09-12 DIAGNOSIS — J4520 Mild intermittent asthma, uncomplicated: Secondary | ICD-10-CM

## 2022-09-12 MED ORDER — MONTELUKAST SODIUM 10 MG PO TABS
ORAL_TABLET | ORAL | 3 refills | Status: AC
Start: 2022-09-12 — End: ?

## 2022-12-12 ENCOUNTER — Other Ambulatory Visit: Payer: Self-pay | Admitting: Internal Medicine

## 2022-12-12 MED ORDER — TADALAFIL 20 MG PO TABS: ORAL_TABLET | ORAL | 1 refills | Status: AC

## 2023-01-21 ENCOUNTER — Other Ambulatory Visit: Payer: Self-pay | Admitting: Internal Medicine

## 2023-01-21 DIAGNOSIS — Z122 Encounter for screening for malignant neoplasm of respiratory organs: Secondary | ICD-10-CM

## 2023-01-22 ENCOUNTER — Other Ambulatory Visit: Payer: Self-pay | Admitting: Internal Medicine

## 2023-01-22 ENCOUNTER — Telehealth (HOSPITAL_BASED_OUTPATIENT_CLINIC_OR_DEPARTMENT_OTHER): Payer: Self-pay | Admitting: Internal Medicine

## 2023-01-22 NOTE — Addendum Note (Signed)
Addended by: Lucky Cowboy on: 01/22/2023 03:48 PM   Modules accepted: Orders

## 2023-03-18 ENCOUNTER — Ambulatory Visit
Admission: RE | Admit: 2023-03-18 | Discharge: 2023-03-18 | Disposition: A | Discharge status: Home / Self Care | Payer: BC Managed Care – PPO | Source: Ambulatory Visit | Attending: Internal Medicine | Admitting: Internal Medicine

## 2023-03-18 DIAGNOSIS — Z122 Encounter for screening for malignant neoplasm of respiratory organs: Secondary | ICD-10-CM

## 2023-03-18 DIAGNOSIS — F17210 Nicotine dependence, cigarettes, uncomplicated: Secondary | ICD-10-CM | POA: Diagnosis not present

## 2023-03-19 ENCOUNTER — Ambulatory Visit: Payer: BC Managed Care – PPO | Admitting: Nurse Practitioner

## 2023-03-19 VITALS — BP 136/80 | HR 83 | Temp 97.9°F | Resp 16 | Ht 68.0 in | Wt 149.8 lb

## 2023-03-19 DIAGNOSIS — M79601 Pain in right arm: Secondary | ICD-10-CM

## 2023-03-19 DIAGNOSIS — R29898 Other symptoms and signs involving the musculoskeletal system: Secondary | ICD-10-CM | POA: Diagnosis not present

## 2023-03-19 DIAGNOSIS — R200 Anesthesia of skin: Secondary | ICD-10-CM | POA: Diagnosis not present

## 2023-03-19 DIAGNOSIS — R202 Paresthesia of skin: Secondary | ICD-10-CM | POA: Diagnosis not present

## 2023-03-19 MED ORDER — PREDNISONE 10 MG PO TABS
ORAL_TABLET | ORAL | 0 refills | Status: AC
Start: 2023-03-19 — End: ?

## 2023-03-19 NOTE — Progress Notes (Signed)
Assessment and Plan:  Derral Colucci was seen today for an episodic visit.  Diagnoses and all order for this visit:  1. Right arm pain (Primary)/numbness and tingling Start steroid taper to decrease inflammation of any contributory cervical etiology. If unsuccessful, discussed updated imaging with cervical x-ray with possible referral to orthopedics or neurology or further review ane evaluation.  - predniSONE (DELTASONE) 10 MG tablet; 1 tab 3 x day for 2 days, then 1 tab 2 x day for 2 days, then 1 tab 1 x day for 3 days  Dispense: 13 tablet; Refill: 0  2. Right arm weakness Continue to monitor for worsening symptoms.  Notify office for further evaluation and treatment, questions or concerns if s/s fail to improve. The risks and benefits of my recommendations, as well as other treatment options were discussed with the patient today. Questions were answered.  Further disposition pending results of labs. Discussed med's effects and SE's.    Over 20 minutes of exam, counseling, chart review, and critical decision making was performed.   Future Appointments  Date Time Provider Department Center  08/31/2023  3:00 PM Lucky Cowboy, MD [facility] None    ------------------------------------------------------------------------------------------------------------------   HPI BP 136/80   Pulse 83   Temp 97.9 F (36.6 C)   Resp 16   Ht 5\' 8"  (1.727 m)   Wt 149 lb 12.8 oz (67.9 kg)   SpO2 99%   BMI 22.78 kg/m   63 y.o.male presents for evaluation of right arm pain. Reports over the last three months, he has noticed an increase in right hand weakness, accompanied by numbness and tingling. He is an avid weightlifter and feels as though he can no longer grip the bars and dumbbells like he once did. Has a painful sensation under the right mid scapula region.  Intermittent.  Sharp, shooting and radiating.  He denies any recent injury or trauma to the right arm. No known hx of cervical  trauma injury or neck pain.  Denies associated neck pain.   Past Medical History:  Diagnosis Date   Allergy      Allergies  Allergen Reactions   Trovan [Alatrofloxacin] Other (See Comments)    Light-headed feeling    Current Outpatient Medications on File Prior to Visit  Medication Sig   albuterol (VENTOLIN HFA) 108 (90 Base) MCG/ACT inhaler Use  2 Inhalations  15 Minutes  Apart  every 4 hours  as needed to Rescue Asthma   Cholecalciferol (VITAMIN D PO) Take 4,000 Units by mouth daily.   fluticasone furoate-vilanterol (BREO ELLIPTA) 200-25 MCG/ACT AEPB Use 1 Inhalation Daily for Asthma   montelukast (SINGULAIR) 10 MG tablet Take 1 tablet daily for Allergies & Asthma                                                                                       /  TAKE                                         BY                                                 MOUTH   Multiple Vitamin (MULTIVITAMIN) tablet Take 1 tablet by mouth daily.   Multiple Vitamins-Minerals (ZINC PO) Take by mouth.   tadalafil (CIALIS) 20 MG tablet Take  1/2 to 1 tablet  every 2 to 3 days  as needed for XXXX                               /                                                                   TAKE                                         BY                                                 MOUTH   vitamin C (ASCORBIC ACID) 500 MG tablet Take 500 mg by mouth daily.   No current facility-administered medications on file prior to visit.    ROS: all negative except what is noted in the HPI.   Physical Exam:  BP 136/80   Pulse 83   Temp 97.9 F (36.6 C)   Resp 16   Ht 5\' 8"  (1.727 m)   Wt 149 lb 12.8 oz (67.9 kg)   SpO2 99%   BMI 22.78 kg/m   General Appearance: NAD.  Awake, conversant and cooperative. Eyes: PERRLA, EOMs intact.  Sclera white.  Conjunctiva without erythema. Sinuses: No frontal/maxillary tenderness.  No nasal discharge. Nares  patent.  ENT/Mouth: Ext aud canals clear.  Bilateral TMs w/DOL and without erythema or bulging. Hearing intact.  Posterior pharynx without swelling or exudate.  Tonsils without swelling or erythema.  Neck: Supple.  No masses, nodules or thyromegaly. Respiratory: Effort is regular with non-labored breathing. Breath sounds are equal bilaterally without rales, rhonchi, wheezing or stridor.  Cardio: RRR with no MRGs. Brisk peripheral pulses without edema.  Abdomen: Active BS in all four quadrants.  Soft and non-tender without guarding, rebound tenderness, hernias or masses. Lymphatics: Non tender without lymphadenopathy.  Musculoskeletal: Full ROM, 5/5 strength, normal ambulation.  No clubbing or cyanosis. Skin: Appropriate color for ethnicity. Warm without rashes, lesions, ecchymosis, ulcers.  Neuro: CN II-XII grossly normal. Normal muscle tone without cerebellar symptoms and intact sensation.   Psych: AO X 3,  appropriate mood and affect, insight and judgment.     Adela Glimpse, NP 4:10 PM [facility]

## 2023-03-20 ENCOUNTER — Other Ambulatory Visit: Payer: Self-pay | Admitting: Nurse Practitioner

## 2023-03-20 DIAGNOSIS — M79601 Pain in right arm: Secondary | ICD-10-CM

## 2023-03-22 ENCOUNTER — Encounter: Payer: Self-pay | Admitting: Nurse Practitioner

## 2023-03-22 NOTE — Patient Instructions (Signed)
 Paresthesia Paresthesia is a burning or prickling feeling. This feeling can happen in any part of the body. It often happens in the hands, arms, legs, or feet. Usually, it is not painful. In most cases, the feeling goes away in a short time and is not a sign of a serious problem. If you have paresthesia that lasts a long time, you need to see your doctor. Follow these instructions at home: Nutrition Eat a healthy diet. This includes: Eating foods that are high in fiber. These include beans, whole grains, and fresh fruits and vegetables. Limiting foods that are high in fat and sugar. These include fried or sweet foods.  Alcohol use  Do not drink alcohol if: Your doctor tells you not to drink. You are pregnant, may be pregnant, or are planning to become pregnant. If you drink alcohol: Limit how much you have to: 0-1 drink a day for women. 0-2 drinks a day for men. Know how much alcohol is in your drink. In the [place] one drink equals one 12 oz bottle of beer (355 mL), one 5 oz glass of wine (148 mL), or one 1 oz glass of hard liquor (44 mL). General instructions Take over-the-counter and prescription medicines only as told by your doctor. Do not smoke or use any products that contain nicotine or tobacco. If you need help quitting, ask your doctor. If you have diabetes, work with your doctor to make sure your blood sugar stays in a healthy range. If your feet feel numb: Check for redness, warmth, and swelling every day. Wear padded socks and comfortable shoes. These help protect your feet. Keep all follow-up visits. Contact a doctor if: You have paresthesia that gets worse or does not go away. You lose feeling (have numbness) after an injury. Your burning or prickling feeling gets worse when you walk. You have pain or cramps. You feel dizzy or you faint. You have a rash. Get help right away if: You feel weak or have new weakness in an arm or leg. You have trouble walking or  moving. You have problems speaking, understanding, or seeing. You feel confused. You cannot control when you pee (urinate) or poop (have a bowel movement). These symptoms may be an emergency. Get help right away. Call 911. Do not wait to see if the symptoms will go away. Do not drive yourself to the hospital. Summary Paresthesia is a burning or prickling feeling. It often happens in the hands, arms, legs, or feet. In most cases, the feeling goes away in a short time and is not a sign of a serious problem. If you have paresthesia that lasts a long time, you need to be seen by your doctor. This information is not intended to replace advice given to you by your health care provider. Make sure you discuss any questions you have with your health care provider. Document Revised: 10/15/2020 Document Reviewed: 10/15/2020 Elsevier Patient Education  2024 [employer].

## 2023-08-19 ENCOUNTER — Encounter: Payer: BC Managed Care – PPO | Admitting: Internal Medicine

## 2023-08-31 ENCOUNTER — Encounter: Payer: BC Managed Care – PPO | Admitting: Internal Medicine

## 2024-03-24 ENCOUNTER — Other Ambulatory Visit: Payer: Self-pay | Admitting: Family Medicine

## 2024-03-24 ENCOUNTER — Telehealth: Payer: Self-pay

## 2024-03-24 DIAGNOSIS — F17210 Nicotine dependence, cigarettes, uncomplicated: Secondary | ICD-10-CM

## 2024-03-24 NOTE — Telephone Encounter (Signed)
 Called and spoke with patient-advised of need for colon recall-patient reports he is going to call his PCP and find out if he could do the cologuard through his PCP instead; patient also reports he will call the office back and schedule his colonoscopy if his PCP suggests the colonoscopy instead of the cologuard;  Patient advised to call back to the office at [phone] should questions/concerns arise; Patient verbalized understanding of information/instructions;

## 2024-04-07 ENCOUNTER — Other Ambulatory Visit
